# Patient Record
Sex: Male | Born: 1950
Health system: Southern US, Community
[De-identification: ages and names within clinical notes are randomized; demographics above are authoritative.]

## PROBLEM LIST (undated history)

## (undated) DIAGNOSIS — G250 Essential tremor: Secondary | ICD-10-CM

## (undated) DIAGNOSIS — E291 Testicular hypofunction: Principal | ICD-10-CM

## (undated) DIAGNOSIS — G2581 Restless legs syndrome: Secondary | ICD-10-CM

## (undated) DIAGNOSIS — E119 Type 2 diabetes mellitus without complications: Secondary | ICD-10-CM

## (undated) DIAGNOSIS — Z87442 Personal history of urinary calculi: Secondary | ICD-10-CM

## (undated) DIAGNOSIS — K760 Fatty (change of) liver, not elsewhere classified: Secondary | ICD-10-CM

## (undated) DIAGNOSIS — G589 Mononeuropathy, unspecified: Secondary | ICD-10-CM

## (undated) DIAGNOSIS — M5136 Other intervertebral disc degeneration, lumbar region: Secondary | ICD-10-CM

## (undated) DIAGNOSIS — J387 Other diseases of larynx: Secondary | ICD-10-CM

## (undated) DIAGNOSIS — N200 Calculus of kidney: Secondary | ICD-10-CM

## (undated) HISTORY — DX: Other diseases of larynx: J38.7

## (undated) HISTORY — DX: Testicular hypofunction: E29.1

## (undated) HISTORY — DX: Essential tremor: G25.0

## (undated) HISTORY — DX: Fatty (change of) liver, not elsewhere classified: K76.0

## (undated) HISTORY — PX: INGUINAL HERNIA REPAIR: SUR1180

## (undated) HISTORY — DX: Other intervertebral disc degeneration, lumbar region: M51.36

## (undated) HISTORY — DX: Personal history of urinary calculi: Z87.442

## (undated) HISTORY — DX: Restless legs syndrome: G25.81

---

## 2000-01-12 ENCOUNTER — Encounter (INDEPENDENT_AMBULATORY_CARE_PROVIDER_SITE_OTHER): Payer: Self-pay | Admitting: *Deleted

## 2000-01-12 ENCOUNTER — Ambulatory Visit (HOSPITAL_BASED_OUTPATIENT_CLINIC_OR_DEPARTMENT_OTHER): Admission: RE | Admit: 2000-01-12 | Discharge: 2000-01-12 | Payer: Self-pay | Admitting: *Deleted

## 2000-11-28 ENCOUNTER — Encounter: Admission: RE | Admit: 2000-11-28 | Discharge: 2000-11-28 | Payer: Self-pay | Admitting: Orthopedic Surgery

## 2000-11-28 ENCOUNTER — Encounter: Payer: Self-pay | Admitting: Orthopedic Surgery

## 2000-12-12 ENCOUNTER — Encounter: Admission: RE | Admit: 2000-12-12 | Discharge: 2000-12-12 | Payer: Self-pay | Admitting: Orthopedic Surgery

## 2000-12-12 ENCOUNTER — Encounter: Payer: Self-pay | Admitting: Orthopedic Surgery

## 2000-12-26 ENCOUNTER — Encounter: Payer: Self-pay | Admitting: Orthopedic Surgery

## 2000-12-26 ENCOUNTER — Encounter: Admission: RE | Admit: 2000-12-26 | Discharge: 2000-12-26 | Payer: Self-pay | Admitting: Orthopedic Surgery

## 2001-07-18 ENCOUNTER — Encounter: Admission: RE | Admit: 2001-07-18 | Discharge: 2001-10-16 | Payer: Self-pay | Admitting: Family Medicine

## 2002-04-11 HISTORY — PX: LUMBAR SPINE SURGERY: SHX701

## 2002-06-20 ENCOUNTER — Inpatient Hospital Stay (HOSPITAL_COMMUNITY): Admission: RE | Admit: 2002-06-20 | Discharge: 2002-06-21 | Payer: Self-pay | Admitting: Orthopaedic Surgery

## 2003-01-04 ENCOUNTER — Emergency Department (HOSPITAL_COMMUNITY): Admission: EM | Admit: 2003-01-04 | Discharge: 2003-01-04 | Payer: Self-pay | Admitting: Emergency Medicine

## 2003-02-10 HISTORY — PX: CYSTOSCOPY W/ URETEROSCOPY W/ LITHOTRIPSY: SUR380

## 2003-02-12 ENCOUNTER — Ambulatory Visit (HOSPITAL_BASED_OUTPATIENT_CLINIC_OR_DEPARTMENT_OTHER): Admission: RE | Admit: 2003-02-12 | Discharge: 2003-02-12 | Payer: Self-pay | Admitting: Urology

## 2003-02-18 ENCOUNTER — Encounter: Payer: Self-pay | Admitting: Emergency Medicine

## 2003-02-18 ENCOUNTER — Emergency Department (HOSPITAL_COMMUNITY): Admission: EM | Admit: 2003-02-18 | Discharge: 2003-02-18 | Payer: Self-pay | Admitting: Emergency Medicine

## 2005-07-12 HISTORY — PX: COLONOSCOPY: SHX174

## 2006-03-29 ENCOUNTER — Ambulatory Visit (HOSPITAL_COMMUNITY): Admission: RE | Admit: 2006-03-29 | Discharge: 2006-03-29 | Payer: Self-pay | Admitting: Specialist

## 2008-03-28 ENCOUNTER — Ambulatory Visit (HOSPITAL_COMMUNITY): Admission: RE | Admit: 2008-03-28 | Discharge: 2008-03-28 | Payer: Self-pay | Admitting: Urology

## 2008-03-28 ENCOUNTER — Emergency Department (HOSPITAL_COMMUNITY): Admission: EM | Admit: 2008-03-28 | Discharge: 2008-03-28 | Payer: Self-pay | Admitting: Emergency Medicine

## 2008-03-30 ENCOUNTER — Emergency Department (HOSPITAL_COMMUNITY): Admission: EM | Admit: 2008-03-30 | Discharge: 2008-03-30 | Payer: Self-pay | Admitting: Emergency Medicine

## 2009-07-12 HISTORY — PX: URETERAL STENT PLACEMENT: SHX822

## 2010-08-02 ENCOUNTER — Emergency Department (HOSPITAL_COMMUNITY)
Admission: EM | Admit: 2010-08-02 | Discharge: 2010-08-03 | Payer: Self-pay | Source: Home / Self Care | Admitting: Emergency Medicine

## 2010-08-04 LAB — URINALYSIS, ROUTINE W REFLEX MICROSCOPIC
Bilirubin Urine: NEGATIVE
Hgb urine dipstick: NEGATIVE
Ketones, ur: NEGATIVE mg/dL
Leukocytes, UA: NEGATIVE
Nitrite: POSITIVE — AB
Protein, ur: NEGATIVE mg/dL
Specific Gravity, Urine: 1.026 (ref 1.005–1.030)
Urine Glucose, Fasting: NEGATIVE mg/dL
Urobilinogen, UA: 1 mg/dL (ref 0.0–1.0)
pH: 5 (ref 5.0–8.0)

## 2010-08-04 LAB — URINE MICROSCOPIC-ADD ON

## 2010-08-05 LAB — URINE CULTURE
Colony Count: NO GROWTH
Culture  Setup Time: 201201231229
Culture: NO GROWTH

## 2010-08-06 ENCOUNTER — Ambulatory Visit: Admission: RE | Admit: 2010-08-06 | Payer: Self-pay | Source: Home / Self Care | Admitting: Urology

## 2010-11-27 NOTE — Op Note (Signed)
[facility]  Patient:    Micheal Guerra, Micheal Guerra                      MRN: [id number] Proc. Date: 01/12/00 Attending:  Maisie Fus B. Samuella Cota, M.D.                           Operative Report  PREOPERATIVE DIAGNOSIS:  Right inguinal hernia.  POSTOPERATIVE DIAGNOSIS:  Right inguinal hernia.  OPERATION:  Repair of right inguinal hernia with mesh.  SURGEON:  Maisie Fus B. Samuella Cota, M.D.  ANESTHESIA:  Local (0.25% Marcaine without epinephrine, 1% Xylocaine without epinephrine and sodium bicarbonate) with anesthesia monitoring - anesthesiologist and CRNA.  PROCEDURE:  Patient was taken to the operating room, placed on table in supine position.  The right lower quadrant of the abdomen was prepped and draped as sterile field.  The local anesthetic was used to block the ilioinguinal nerve and the area for the oblique incision.  An oblique incision was outlined with the skin marker.  The incision was made through skin and subcutaneous tissues with subcutaneous bleeders being cauterized with the Bovie.  The external oblique aponeurosis was identified and divided in line of its fibers to and through the external ring.  There was a nerve coming out of the external oblique aponeurosis medially and crossing the lower portion of the wound. This was protected.  The patient had a very large cord with hernia sac.  The cord was dissected free and a Penrose drain placed around it.  Dissection revealed a large lipoma of the cord coming off laterally.  Medially, the patient had a large indirect hernia sac, which extended down some 5-6 cm and was quite adherent to the cord structures.  Patient also had direct defect with a large protrusion of tissue coming through a large defect in the inguinal floor.  The hernia sac was opened and was noted to contain only omentum.  The omentum was reduced into the peritoneal cavity.  The excess hernia sac was removed and then the peritoneum was closed  with a running suture of 0 chromic catgut with the two ends tied to each other.  The lipoma of the cord was dissected back to its base at the internal ring where it was amputated and suture ligated at the base with 3-0 Vicryl sutures.  A 0 chromic was used to embrocate the inguinal floor, reducing the direct defect.  A piece of 3 inch x 6 inch mesh was then placed over the entire inguinal floor and extended around the cord structures superiorly and lateral to the internal ring.  The mesh was anchored inferiorly with a running suture of 2-0 Novofil with the first suture being placed in the pubic tubercle, the other sutures being placed in the shelving edge of Pouparts ligament.  The mesh was anchored superiorly and medially with interrupted sutures of 0 Novofil.  A slit was made into the mesh to accommodate the cord structures in the internal ring.  The mesh was overlapped superiorly and laterally, where the mesh was sutured to itself using 3-0 Vicryl.   Mesh seemed to be lying nicely in the inguinal floor.  Hemostasis was obtained.  The cord structures were still fairly thick, but there was no evidence of any further lipoma.  The cord structures were returned to their normal anatomical position and the external oblique aponeurosis was reapproximated with interrupted sutures of 3-0 Vicryl. The external ring was  slightly large, but because of the nerves in the area, it was elected not to try and make the external ring any smaller.  Wound was irrigated, Scarpas fascia was closed with 3-0 Vicryl and the skin was closed with a running subcuticular suture of 5-0 Vicryl.  Benzoin and half-inch Steri-Strips were used to reinforce the skin closure.  Dry sterile dressing was applied.  Patient seemed to tolerate procedure well and was taken to the PACU in satisfactory condition. DD:  01/12/00 TD:  01/12/00 Job: [id number]

## 2010-11-27 NOTE — Op Note (Signed)
NAME:  ROCKLAND, KOTARSKI NO.:  [id number]   MEDICAL RECORD NO.:  [id number]                   PATIENT TYPE:  INP   LOCATION:  2861                                 FACILITY:  [facility]   PHYSICIAN:  Mark C. Ophelia Charter, M.D.                 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   OPERATION PERFORMED:  L4-5 decompression, bilateral foraminotomies and  bilateral microdiskectomy.   SURGEON:  Mark C. Ophelia Charter, M.D.   ASSISTANT:  Dorann Lodge, P.A.-C.   ANESTHESIA:  General.  Marcaine skin local 0.5% 10 cc at skin closure.   ESTIMATED BLOOD LOSS:  100 cc.   DESCRIPTION OF PROCEDURE:  After induction of general anesthesia,  orotracheal intubation, preoperative Ancef prophylaxis, the patient was  placed in the kneeling position on the [place] frame with careful  positioning.  The back was prepped with DuraPrep in the usual area of square  towels, Betadine Vi-Drape and laminectomy sheets and drapes.  Cross table  lateral x-ray confirmed that the needle was just above the L4-5 space and a  second x-ray was taken with the needle positioned more distally.  After x-  ray a midline incision was made.  A self-retaining Weitlaner retractor was  placed.  Subperiosteal dissection on the lamina was performed.  The L4-5  space was identified by palpation and the L5-S1 space on x-ray was  correlated with the patient's findings and the posterior elements were a  solid mass of bone at L5-S1.  The L4-5 disk was actually several centimeters  inferior to the top of the iliac crest.  The L3-4 space was slightly above  it on a cross table lateral x-ray.  Due to the patient's abnormal anatomy, a  Kocher  clamp was placed directly over the expected L4-5 level.  Towel clips  were used to check for motion and the first level of motion was actually the  labeled L4-5 space which was the level of the pathology.  Cross table  lateral x-ray confirmed that the pedicle was directly over the L4-5 space  and inferior aspect of the spinous process was removed, the superior aspect  of the spinous process below was removed and laminotomy was performed  bilaterally with removal of thick hypertrophic ligaments.  There was  considerable more facet overgrowth than visualized on the preoperative MRI.  There was significant compression of the dura from right and left and after  removal of the ligament there was still significant facet overhang which was  causing compression.  Bone was removed out to the level of the pedicle and  foramina was enlarged bilaterally removing some large inferior hanging spurs  up  on the shoulder of the nerve root causing significant displacement which  also was not well visualized by the preop MRI scan.  Nerve root completely  decompressed and on each side the shoulder of the nerve root at the disk was  inspected.  There was a spur which appeared to be a large traction spur and  the disk was calcified in the midline but was ballottable.  This may have  been calcification of the posterior longitudinal ligament or possibly an old  portion of disk that had herniated and had been calcified.  In any event a  small puncture site was made with a #4.  A 2 mm Kerrison was entered and  bone was removed until a large enough hole was made that progressively  larger Kerrison's could be inserted to remove enough bone to fit the  micropituitary and then the large pituitary.  In the midline continued  dissection underneath the some remaining portion of the ligament was  performed and the thin portion of bone was broken off using Kerrison  rongeurs and meticulously microdissected off the anterior  aspect of the dura  and removed.  Straight pituitaries were used to remove spurring until it was  flat with the base of the canal.  Palpation with hockey stick was performed  anterior to the dura with 180 degree sweep.  Microscope which was being used  for microdissection was switched back and forth from right to lateral gutter  and gentle retraction of the dura was performed to make sure that there was  complete decompression.  Passes with the straight pituitary revealed a large  amount of degenerative disk material which was immediately underneath the  old calcified bone which probably represented an old disk herniation with  calcification.  Once this was decompressed, there was no anterior  compression, both foramina were enlarged, carefully inspected.  Axilla of  the nerve root was well visualized.  The shoulder of the nerve root was  free.  Hockey stick could be placed out the dura anterolateral and inferior  to the nerve root with no areas of compression.  Disk was irrigated and  suctioned dry.  Operating microscope was removed and the fascia was closed  with 0 Vicryl and 2-0 Vicryl in the subcutaneous tissue.  Skin staple  closure, Marcaine infiltration in the skin and a postoperative dressing.  Instrument and needle count was correct.  The patient tolerated the  procedure well, was transferred to the recovery room in stable condition.                                               Mark C. Ophelia Charter, M.D.    MCY/MEDQ  D:  06/20/2002  T:  06/20/2002  Job:  [id number]

## 2010-11-27 NOTE — Op Note (Signed)
NAME:  AXCEL, HORSCH                       ACCOUNT NO.:  [id number]   MEDICAL RECORD NO.:  [id number]                   PATIENT TYPE:  AMB   LOCATION:  [facility]                                 FACILITY:  [facility]   PHYSICIAN:  Excell Seltzer. Annabell Howells, M.D.                 DATE OF BIRTH:  1951/06/22   DATE OF PROCEDURE:  02/12/2003  DATE OF DISCHARGE:                                 OPERATIVE REPORT   PROCEDURE:  Cystoscopy, left retrograde pyelogram with interpretation, left  ureteroscopy with holmium lasertripsy, insertion of left double J stent.   PREOPERATIVE DIAGNOSIS:  Left ureteral stone.   POSTOPERATIVE DIAGNOSIS:  Left renal stone.   SURGEON:  Excell Seltzer. Annabell Howells, M.D.   ANESTHESIA:  General.   DRAINS:  6 French x 26 cm double J stent.   SPECIMENS:  Stone fragments.   COMPLICATIONS:  None.   INDICATIONS FOR PROCEDURE:  Mr. Corpus is a 60 year old white male with a  large asymptomatic left ureteral stone that has not progressed. He is to  undergo a ureteroscopic stone extraction.   DESCRIPTION OF PROCEDURE:  The patient was taken to the operating room and  he was given p.o. Cipro preoperatively. He was given a general anesthetic,  placed in lithotomy position, his perineum and genitalia were prepped with  Betadine solution, he was draped in the usual sterile fashion. Cystoscopy  was performed using a 22 [place] scope and the 12 and 70 degree lenses.  Examination revealed a normal urethra. The external sphincter was intact,  the prostatic urethra short without obstruction. Examination of the bladder  revealed mild trabeculation without tumor, stones or inflammation. The  ureteral orifices were in their normal anatomic position effluxing clear  urine. The left ureteral orifice was cannulated with a 5 [place] open end  catheter, contrast was instilled in a retrograde fashion. Surprisingly there  was no evidence of filling defect in the ureter. However, in the renal  pelvis there was a  triangular shaped filling defect consistent with a stone  which had previously been noted in the ureter. I re-reviewed his CT scan and  there were no large stones in the kidney on the CT scan just a large  ureteral stone. At this point, it was felt that flexible ureteroscopy was  going to be required. A guidewire was placed in the kidney, a 12/14 access  sheath 35 cm in length was passed over the wire just below the kidney. The  central coil and wire were removed and a 6 1/2 [place] flexible ureteroscope  were passed. The stone was identified and engaged with the homium laser and  fragmented into 2-3 mm fragments. At this point, a basket was used to  extract as many of the fragments as possible. A few small fragments were  left in the kidney but were felt to be small enough to pass. At this point,  the ureteroscope was removed, the  guidewire was placed back up the access  sheath, the access sheath was removed. The cystoscope was replaced over the  wire and a 6 French 26 cm double J stent was inserted without difficulty  over the wire under fluoroscopic guidance. The wire was removed leaving a  good coil in the bladder, good  coil in the kidney. The bladder was drained. The cystoscope was removed  leaving the stent string exiting the urethra. The patient was taken down  from lithotomy position, his anesthetic was reversed, he was moved to the  recovery room in stable condition and there were no complications.                                               Excell Seltzer. Annabell Howells, M.D.    JJW/MEDQ  D:  02/12/2003  T:  02/12/2003  Job:  [id number]

## 2011-03-29 ENCOUNTER — Encounter: Payer: Self-pay | Admitting: Gastroenterology

## 2011-04-05 ENCOUNTER — Telehealth: Payer: Self-pay | Admitting: *Deleted

## 2011-04-05 ENCOUNTER — Ambulatory Visit (AMBULATORY_SURGERY_CENTER): Payer: BC Managed Care – PPO | Admitting: *Deleted

## 2011-04-05 VITALS — Ht 72.0 in | Wt 250.0 lb

## 2011-04-05 DIAGNOSIS — Z1211 Encounter for screening for malignant neoplasm of colon: Secondary | ICD-10-CM

## 2011-04-05 MED ORDER — PEG-KCL-NACL-NASULF-NA ASC-C 100 G PO SOLR: ORAL | Status: DC

## 2011-04-05 NOTE — Telephone Encounter (Signed)
Pt. Received a letter from [facility] that it was time for his colon recall done 5 years ago.  Pt. Says as far as he knows he did not have any polyps and does not have family history of colon cancer.  Release of medical information  Obtained and given to Robin Stallings.  . JoAnn Janifer Gieselman   

## 2011-04-05 NOTE — Progress Notes (Signed)
Pt. Received a letter from [facility] that it was time for his colon recall done 5 years ago.  Pt. Says as far as he knows he did not have any polyps and does not have family history of colon cancer.  Release of medical information  Obtained and given to Merri Ray.  Wyona Almas

## 2011-04-06 ENCOUNTER — Encounter: Payer: Self-pay | Admitting: Gastroenterology

## 2011-04-12 LAB — URINALYSIS, ROUTINE W REFLEX MICROSCOPIC
Glucose, UA: NEGATIVE
Hgb urine dipstick: NEGATIVE
Ketones, ur: NEGATIVE
Leukocytes, UA: NEGATIVE
Nitrite: NEGATIVE
Protein, ur: NEGATIVE
Urobilinogen, UA: 0.2

## 2011-04-12 LAB — BASIC METABOLIC PANEL
BUN: 19
Chloride: 109
Potassium: 4.4

## 2011-04-12 LAB — CBC
HCT: 43.2
Hemoglobin: 14.9
MCV: 89.3
Platelets: 246
RBC: 4.84
WBC: 10.4

## 2011-04-12 LAB — POCT I-STAT, CHEM 8
Calcium, Ion: 1.16
Creatinine, Ser: 1.3
Glucose, Bld: 107 — ABNORMAL HIGH
Glucose, Bld: 134 — ABNORMAL HIGH
HCT: 42
Hemoglobin: 14.3
Hemoglobin: 15.3
Potassium: 4.7
Sodium: 134 — ABNORMAL LOW
TCO2: 26

## 2011-04-12 LAB — DIFFERENTIAL
Eosinophils Absolute: 0.5
Eosinophils Relative: 5
Lymphs Abs: 2.4
Monocytes Absolute: 0.8
Monocytes Relative: 8

## 2011-04-12 LAB — URINE MICROSCOPIC-ADD ON

## 2011-04-13 ENCOUNTER — Telehealth: Payer: Self-pay | Admitting: Gastroenterology

## 2011-04-14 NOTE — Telephone Encounter (Signed)
Pts family called back and cancelled the appt for the colon. Family states that they called [facility] and that the pt had no polyps on the previous colon.

## 2011-04-15 NOTE — Telephone Encounter (Signed)
Pts colonoscopy report was received on 04/05/2011 the day the release was signed I Gave the report back to previsit to put on the pts chart for Dr Arlyce Dice to review. Instead of giving it directly to Dr Arlyce Dice  Pt has cancelled colonoscopy due to pt having a normal colonoscopy in 2007.

## 2011-04-15 NOTE — Telephone Encounter (Signed)
This report was received on 04/05/2011 I faxed release and received report back instantaneously I gave the report back to Previsit to put on the front of pts chart.  Never documented that the report was actually received.  I currently have the report and will have scan in for the pts records. Pt has cancelled colonoscopy due to pt having a normal colonoscopy in 2007

## 2011-04-19 ENCOUNTER — Other Ambulatory Visit: Payer: Self-pay | Admitting: Gastroenterology

## 2011-07-13 DIAGNOSIS — J387 Other diseases of larynx: Secondary | ICD-10-CM

## 2011-07-13 HISTORY — DX: Other diseases of larynx: J38.7

## 2011-07-27 ENCOUNTER — Other Ambulatory Visit: Payer: Self-pay | Admitting: Otolaryngology

## 2011-07-27 ENCOUNTER — Ambulatory Visit
Admission: RE | Admit: 2011-07-27 | Discharge: 2011-07-27 | Disposition: A | Discharge status: Home / Self Care | Payer: BC Managed Care – PPO | Source: Ambulatory Visit | Attending: Otolaryngology | Admitting: Otolaryngology

## 2011-07-27 DIAGNOSIS — R498 Other voice and resonance disorders: Secondary | ICD-10-CM

## 2011-07-27 DIAGNOSIS — E079 Disorder of thyroid, unspecified: Secondary | ICD-10-CM

## 2011-07-27 DIAGNOSIS — R490 Dysphonia: Secondary | ICD-10-CM

## 2011-07-27 MED ORDER — IOHEXOL 300 MG/ML  SOLN
75.0000 mL | Freq: Once | INTRAMUSCULAR | Status: AC | PRN
  Administered 2011-07-27: 75 mL via INTRAVENOUS

## 2011-08-09 ENCOUNTER — Other Ambulatory Visit: Payer: Self-pay | Admitting: Family Medicine

## 2011-08-09 ENCOUNTER — Telehealth: Payer: Self-pay | Admitting: Family Medicine

## 2011-08-09 ENCOUNTER — Ambulatory Visit (HOSPITAL_BASED_OUTPATIENT_CLINIC_OR_DEPARTMENT_OTHER): Payer: BC Managed Care – PPO

## 2011-08-09 ENCOUNTER — Encounter: Payer: Self-pay | Admitting: Family Medicine

## 2011-08-09 ENCOUNTER — Ambulatory Visit (INDEPENDENT_AMBULATORY_CARE_PROVIDER_SITE_OTHER): Payer: BC Managed Care – PPO | Admitting: Family Medicine

## 2011-08-09 DIAGNOSIS — M545 Low back pain, unspecified: Secondary | ICD-10-CM

## 2011-08-09 DIAGNOSIS — M79609 Pain in unspecified limb: Secondary | ICD-10-CM

## 2011-08-09 DIAGNOSIS — G2581 Restless legs syndrome: Secondary | ICD-10-CM

## 2011-08-09 DIAGNOSIS — R9431 Abnormal electrocardiogram [ECG] [EKG]: Secondary | ICD-10-CM

## 2011-08-09 DIAGNOSIS — R635 Abnormal weight gain: Secondary | ICD-10-CM

## 2011-08-09 DIAGNOSIS — G4710 Hypersomnia, unspecified: Secondary | ICD-10-CM | POA: Insufficient documentation

## 2011-08-09 DIAGNOSIS — R160 Hepatomegaly, not elsewhere classified: Secondary | ICD-10-CM

## 2011-08-09 DIAGNOSIS — M79602 Pain in left arm: Secondary | ICD-10-CM

## 2011-08-09 DIAGNOSIS — R209 Unspecified disturbances of skin sensation: Secondary | ICD-10-CM

## 2011-08-09 DIAGNOSIS — M436 Torticollis: Secondary | ICD-10-CM

## 2011-08-09 DIAGNOSIS — R5381 Other malaise: Secondary | ICD-10-CM

## 2011-08-09 DIAGNOSIS — R202 Paresthesia of skin: Secondary | ICD-10-CM

## 2011-08-09 DIAGNOSIS — R5383 Other fatigue: Secondary | ICD-10-CM

## 2011-08-09 HISTORY — DX: Restless legs syndrome: G25.81

## 2011-08-09 LAB — LIPID PANEL
Total CHOL/HDL Ratio: 3.6 Ratio
VLDL: 24 mg/dL (ref 0–40)

## 2011-08-09 LAB — COMPREHENSIVE METABOLIC PANEL
AST: 47 U/L — ABNORMAL HIGH (ref 0–37)
Albumin: 4.6 g/dL (ref 3.5–5.2)
Alkaline Phosphatase: 97 U/L (ref 39–117)
Potassium: 4.8 mEq/L (ref 3.5–5.3)
Sodium: 141 mEq/L (ref 135–145)
Total Protein: 7.2 g/dL (ref 6.0–8.3)

## 2011-08-09 MED ORDER — CLONAZEPAM 1 MG PO TABS: ORAL_TABLET | ORAL | Status: DC

## 2011-08-09 NOTE — Assessment & Plan Note (Addendum)
Likely related to deconditioning and obesity. However, with his excessive somnolence and subtle pulmonary-type EKG abnormalities I want to check a home sleep study to r/o OSA. Will also check routine labs today (fasting): CBC, CMET, testosterone, vit B12 level.  Pt says a thyroid hormone check by his ENT was recently normal.

## 2011-08-09 NOTE — Telephone Encounter (Signed)
Pls request records from Dr. Haroldine Laws, ENT on [facility] in [place].  thx.

## 2011-08-09 NOTE — Assessment & Plan Note (Signed)
RAE+ poor R wave progression.  Some changes suggestive of pulm dz, but he has no hx of smoking. Will pursue sleep study to r/o OSA.

## 2011-08-09 NOTE — Assessment & Plan Note (Signed)
No sign of fluid overload on exam EXCEPT for possibly in his abdomen. With question of hepatomegaly on exam I'll check complete abdominal ultrasound. He'll definitely need to start low cal/low fat diet soon.

## 2011-08-09 NOTE — Patient Instructions (Signed)
Please give patient directions/map to [facility].  

## 2011-08-09 NOTE — Assessment & Plan Note (Signed)
Suspect cervical spine DDD with radiculopathy. I have low suspicion that this is cardiac related. Will check C-spine plain film, may make PT referral at next f/u in 10d. If c-spine highly unremarkable for arthritic changes and sleep study is negative for OSA, then would refer to cardiology for risk stratification/anginal equivalent.

## 2011-08-09 NOTE — Progress Notes (Signed)
Office Note 08/09/2011  CC:  Chief Complaint  Patient presents with  . Establish Care    throat infection, bulge in stomach    HPI:  Micheal Guerra is a 61 y.o. White male who is here to establish care and discuss throat infection and other issues. Patient's most recent primary MD: none Old records were not reviewed prior to or during today's visit.  Pt here with his wife.  They outline numerous concerns lately:  1) Recent loss of voice, question of anterior neck mass/swelling, they sought care at ENT (Dr. Haroldine Laws) and w/u revealed small laryngeal polyp and "throat infection" per pt report.  He was put on cefuroxime 250 mg po bid and has been on this x 2d.  He was also given mucinex ER. 2) midline abdominal protrusion present for years: ? Getting more prominent last 1-2 mo.  No pain.  The area "pooches" out and then goes back in.   3) Wt gain 10-15 lbs in last month or so.  Says he's had no change in eating habits or activity during this time.  Also notes increased tendency to be breathless with normal activity but seems to have good days and bad days with regard to this.  +Fatigue.  No fevers.   4) Reports tingling, numbness, irresistible urge to move legs while trying to go to sleep. Has been going on for months. 5) Left arm with intermittent feeling of achiness, tingling, "asleep" feeling--from the shoulder all the way down to fingers.  Not related to exercise/exertion, but certain arm movements do sometimes make it occur or relieve it some.  Denies neck pain but admits his neck is fairly stiff, has limited ROM.  Has no hx of neck problems or neck surgery.   Denies any CP, nausea, diaphoresis, SOB, or palpitations associated with these left arm sensations. 6) Low back pain lately, with some intermittent radiation down R leg> L leg.  Describes tingling/numbness at times with the radicular pain.    Past Medical History  Diagnosis Date  . Hereditary essential tremor   . History of  nephrolithiasis   . DDD (degenerative disc disease), lumbar     Past Surgical History  Procedure Date  . Lumbar spine surgery 2002    ruptured disk  . Inguinal hernia repair 2004  and  2005    bilateral  . Colonoscopy 2007    normal per pt report    Family History  Problem Relation Age of Onset  . Cancer Mother     breast  . Heart disease Father     CAD/MI in his 45s  . Hypertension Father   . Diabetes Sister     History   Social History  . Marital Status: Married    Spouse Name: N/A    Number of Children: N/A  . Years of Education: N/A   Occupational History  . Not on file.   Social History Main Topics  . Smoking status: Never Smoker   . Smokeless tobacco: Former Neurosurgeon    Types: Chew    Quit date: 06/12/2011  . Alcohol Use: No  . Drug Use: No  . Sexually Active: Not on file   Other Topics Concern  . 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   MEDS: ranitidine, ibuprofen, cefuroxime, mucinex ER  No Known Allergies  ROS Review of Systems  Constitutional: Positive for fatigue and unexpected weight change. Negative for fever, chills and appetite change.  HENT: Positive for hearing loss (?worse the last month: + noise exposure chronically at work). Negative for ear pain, congestion, sore throat, neck stiffness and dental problem.   Eyes: Negative for discharge, redness and visual disturbance.  Respiratory: Positive for apnea (wife witnesses frequent sleep apeneic events, +snoring). Negative for cough, chest tightness, shortness of breath and wheezing.   Cardiovascular: Negative for chest pain, palpitations and leg swelling.  Gastrointestinal: Negative for nausea, vomiting,  abdominal pain, diarrhea and blood in stool.       +heartburn  Genitourinary: Positive for flank pain (intermittent lately, mainly in mornings, worse with getting up and moving around). Negative for dysuria, urgency, frequency, hematuria and difficulty urinating.  Musculoskeletal: Positive for back pain. Negative for myalgias, joint swelling and arthralgias.  Skin: Negative for pallor and rash.  Neurological: Negative for dizziness, speech difficulty, weakness and headaches.  Hematological: Negative for adenopathy. Does not bruise/bleed easily.  Psychiatric/Behavioral: Positive for sleep disturbance. Negative for confusion and dysphoric mood. The patient is not nervous/anxious.     PE; Blood pressure 151/89, pulse 72, temperature 98.6 F (37 C), temperature source Temporal, height 6' (1.829 m), weight 260 lb (117.935 kg), SpO2 94.00%. Gen: Alert, well appearing, obese white male.  Patient is oriented to person, place, time, and situation.  Affect is pleasant, thought and speech are lucid. ENT: Ears: EACs clear, normal epithelium.  TMs with good light reflex and landmarks bilaterally.  Eyes: no injection, icteris, swelling, or exudate.  EOMI, PERRLA. Nose: no drainage or turbinate edema/swelling.  No injection or focal lesion.  Mouth: lips without lesion/swelling.  Oral mucosa pink and moist.  Dentition intact and without obvious caries or gingival swelling.  Oropharynx without erythema, exudate, or swelling.  Neck - No masses or thyromegaly.  Neck ROM limited/stiff, but no pain.  Carotids 1+ bilat, without bruit. Neck nontender to palpation.   Spurling's negative. RRR, without murmur, rub, or gallop. Carotid pulses easily palpable, symmetric pulsations, no bruit or palpable dilatation. Radial, femoral, and ankle pulses easily palpable and symmetric. No abdominal bruit. No varicosities.  Cap refill in extremities is brisk. Chest with symmetric expansion, good aeration, nonlabored respirations.   Clear and equal breath sounds in all lung fields.  No clubbing or cyanosis. ABD: soft, question of mild distention vs rotund.  Mild TTP in RUQ when pt takes deep breaths.  Difficult to discern hepatomegaly due to body habitus, but I question firm, tender lower border of liver about 4 finger breadths below right costal margin.  BS normal.   No bruit or mass.  His abd is generally pretty dull to percussion all over.  With contraction of abd wall muscles he has a large midline soft tissue protrusion that is soft and extends from epigastric level down to below umbillicus, consistent with diastasis recti.  No tenderness in this region.  Low back: nontender.  +pain with flexion of L-spine but all other motions without pain.   Trace Patellar and achilles DTRs bilat.  No LE weakness.   SKIN: no jaundice, rash, or pallor.  Pertinent labs:  12 lead EKG: NSR, RAE, poor R wave progression.  No changes suggestive of acute ischemia.    ASSESSMENT AND PLAN:   Fatigue Likely related to deconditioning and obesity. However, with his excessive somnolence  and subtle pulmonary-type EKG abnormalities I want to check a home sleep study to r/o OSA. Will also check routine labs today (fasting): CBC, CMET, testosterone, vit B12 level.  Pt says a thyroid hormone check by his ENT was recently normal.    Weight gain, abnormal No sign of fluid overload on exam EXCEPT for possibly in his abdomen. With question of hepatomegaly on exam I'll check complete abdominal ultrasound. He'll definitely need to start low cal/low fat diet soon.  Restless legs syndrome Check ferritin. Start clonazepam 1mg , 1/2-1 tab po qhs.  Therapeutic expectations and side effect profile of medication discussed today.  Patient's questions answered.   Left arm pain Suspect cervical spine DDD with radiculopathy. I have low suspicion that this is cardiac related. Will check C-spine plain film, may make PT referral at next f/u in 10d. If c-spine  highly unremarkable for arthritic changes and sleep study is negative for OSA, then would refer to cardiology for risk stratification/anginal equivalent.    Abnormal EKG RAE+ poor R wave progression.  Some changes suggestive of pulm dz, but he has no hx of smoking. Will pursue sleep study to r/o OSA.   Laryngeal pathology: will obtain recent ENT eval records.  He'll take his abx and mucinex as rx'd by Dr. Haroldine Laws.  Low back pain: DDD, possble spinal stenosis.  Check L-spine plain films to start.  Return in about 10 days (around 08/19/2011) for f/u RLS, wt gain, neck and back pain.

## 2011-08-09 NOTE — Assessment & Plan Note (Signed)
Check ferritin. Start clonazepam 1mg , 1/2-1 tab po qhs.  Therapeutic expectations and side effect profile of medication discussed today.  Patient's questions answered.

## 2011-08-10 ENCOUNTER — Encounter: Payer: Self-pay | Admitting: Family Medicine

## 2011-08-10 DIAGNOSIS — E291 Testicular hypofunction: Secondary | ICD-10-CM | POA: Insufficient documentation

## 2011-08-10 HISTORY — DX: Testicular hypofunction: E29.1

## 2011-08-10 LAB — CBC WITH DIFFERENTIAL/PLATELET
Basophils Absolute: 0.1 10*3/uL (ref 0.0–0.1)
Basophils Relative: 1 % (ref 0–1)
Hemoglobin: 16.1 g/dL (ref 13.0–17.0)
MCHC: 34.4 g/dL (ref 30.0–36.0)
Monocytes Relative: 9 % (ref 3–12)
Neutro Abs: 6.2 10*3/uL (ref 1.7–7.7)
Neutrophils Relative %: 58 % (ref 43–77)
RDW: 12.8 % (ref 11.5–15.5)
WBC: 10.7 10*3/uL — ABNORMAL HIGH (ref 4.0–10.5)

## 2011-08-10 LAB — TESTOSTERONE: Testosterone: 134.22 ng/dL — ABNORMAL LOW (ref 250–890)

## 2011-08-10 NOTE — Telephone Encounter (Signed)
Faxed request

## 2011-08-11 ENCOUNTER — Ambulatory Visit (INDEPENDENT_AMBULATORY_CARE_PROVIDER_SITE_OTHER)
Admission: RE | Admit: 2011-08-11 | Discharge: 2011-08-11 | Disposition: A | Discharge status: Home / Self Care | Payer: BC Managed Care – PPO | Source: Ambulatory Visit | Attending: Family Medicine | Admitting: Family Medicine

## 2011-08-11 ENCOUNTER — Ambulatory Visit (HOSPITAL_BASED_OUTPATIENT_CLINIC_OR_DEPARTMENT_OTHER)
Admission: RE | Admit: 2011-08-11 | Discharge: 2011-08-11 | Disposition: A | Discharge status: Home / Self Care | Payer: BC Managed Care – PPO | Source: Ambulatory Visit | Attending: Family Medicine | Admitting: Family Medicine

## 2011-08-11 DIAGNOSIS — N1330 Unspecified hydronephrosis: Secondary | ICD-10-CM | POA: Insufficient documentation

## 2011-08-11 DIAGNOSIS — R9431 Abnormal electrocardiogram [ECG] [EKG]: Secondary | ICD-10-CM

## 2011-08-11 DIAGNOSIS — R5381 Other malaise: Secondary | ICD-10-CM

## 2011-08-11 DIAGNOSIS — M545 Low back pain, unspecified: Secondary | ICD-10-CM

## 2011-08-11 DIAGNOSIS — R202 Paresthesia of skin: Secondary | ICD-10-CM

## 2011-08-11 DIAGNOSIS — M899 Disorder of bone, unspecified: Secondary | ICD-10-CM

## 2011-08-11 DIAGNOSIS — R0989 Other specified symptoms and signs involving the circulatory and respiratory systems: Secondary | ICD-10-CM

## 2011-08-11 DIAGNOSIS — M949 Disorder of cartilage, unspecified: Secondary | ICD-10-CM

## 2011-08-11 DIAGNOSIS — M5380 Other specified dorsopathies, site unspecified: Secondary | ICD-10-CM

## 2011-08-11 DIAGNOSIS — R82998 Other abnormal findings in urine: Secondary | ICD-10-CM | POA: Insufficient documentation

## 2011-08-11 DIAGNOSIS — R635 Abnormal weight gain: Secondary | ICD-10-CM | POA: Insufficient documentation

## 2011-08-11 DIAGNOSIS — M436 Torticollis: Secondary | ICD-10-CM

## 2011-08-11 DIAGNOSIS — R5383 Other fatigue: Secondary | ICD-10-CM

## 2011-08-11 DIAGNOSIS — M79602 Pain in left arm: Secondary | ICD-10-CM

## 2011-08-11 DIAGNOSIS — IMO0002 Reserved for concepts with insufficient information to code with codable children: Secondary | ICD-10-CM

## 2011-08-11 DIAGNOSIS — R160 Hepatomegaly, not elsewhere classified: Secondary | ICD-10-CM | POA: Insufficient documentation

## 2011-08-11 DIAGNOSIS — M4802 Spinal stenosis, cervical region: Secondary | ICD-10-CM

## 2011-08-11 LAB — PROLACTIN: Prolactin: 3.4 ng/mL (ref 2.1–17.1)

## 2011-08-11 LAB — LUTEINIZING HORMONE

## 2011-08-11 NOTE — Progress Notes (Signed)
Quick Note:  Pls notify pt that his x-rays confirmed significant arthritis in neck and low back and this can explain his low back pains and left arm sx's. I recommend physical therapy as the next step. If he agrees now then I'll order this. If they prefer to wait and discuss at f/u visit that is fine, too. Also, his chest x-ray was unremarkable. His abdominal ultrasound showed some fatty infiltration in the liver but otherwise was normal. His cholesterol testing was normal but sometimes levels are normal in blood but there is excessive fatty collection in the liver still. I'd like to do some additional blood work next visit : advanced lipid testing. I also want to repeat his testosterone level b/c it was quite low and if retesting confirms that this is truly very low then will need to start testosterone replacement therapy. We'll go over things in detail at his f/u visit. He does NOT need to come to next appt fasting. Thx ______

## 2011-08-12 ENCOUNTER — Telehealth: Payer: Self-pay | Admitting: Family Medicine

## 2011-08-12 NOTE — Telephone Encounter (Signed)
Do we have the results from the [place] yet? Patient's wife is on DPR, okay to release info. She will be home after 3PM.

## 2011-08-12 NOTE — Telephone Encounter (Signed)
Answered in result note 

## 2011-08-13 ENCOUNTER — Other Ambulatory Visit: Payer: Self-pay | Admitting: Family Medicine

## 2011-08-13 MED ORDER — TRAMADOL HCL 50 MG PO TABS: ORAL_TABLET | ORAL | Status: DC

## 2011-08-13 NOTE — Progress Notes (Signed)
We can try some tramadol to use only sparingly.  I'll do eRX.  Please notify him that this med may cause slight sedation and it can be habit-forming, so I encourage him to use it no more than 1-2 times per day, and preferably not every day.  -thx

## 2011-08-19 ENCOUNTER — Ambulatory Visit (INDEPENDENT_AMBULATORY_CARE_PROVIDER_SITE_OTHER): Payer: BC Managed Care – PPO | Admitting: Family Medicine

## 2011-08-19 ENCOUNTER — Encounter: Payer: Self-pay | Admitting: Family Medicine

## 2011-08-19 VITALS — BP 151/93 | HR 65 | Ht 72.0 in | Wt 257.0 lb

## 2011-08-19 DIAGNOSIS — R5383 Other fatigue: Secondary | ICD-10-CM

## 2011-08-19 DIAGNOSIS — K760 Fatty (change of) liver, not elsewhere classified: Secondary | ICD-10-CM

## 2011-08-19 DIAGNOSIS — J387 Other diseases of larynx: Secondary | ICD-10-CM

## 2011-08-19 DIAGNOSIS — R5381 Other malaise: Secondary | ICD-10-CM

## 2011-08-19 DIAGNOSIS — K7689 Other specified diseases of liver: Secondary | ICD-10-CM

## 2011-08-19 DIAGNOSIS — M479 Spondylosis, unspecified: Secondary | ICD-10-CM

## 2011-08-19 DIAGNOSIS — E291 Testicular hypofunction: Secondary | ICD-10-CM

## 2011-08-19 DIAGNOSIS — G2581 Restless legs syndrome: Secondary | ICD-10-CM

## 2011-08-19 NOTE — Progress Notes (Signed)
Home sleep study cancelled per MD request.

## 2011-08-19 NOTE — Progress Notes (Signed)
Addended by: Luisa Dago on: 08/19/2011 05:14 PM   Modules accepted: Orders

## 2011-08-20 ENCOUNTER — Ambulatory Visit (INDEPENDENT_AMBULATORY_CARE_PROVIDER_SITE_OTHER): Payer: BC Managed Care – PPO | Admitting: Internal Medicine

## 2011-08-20 ENCOUNTER — Encounter: Payer: Self-pay | Admitting: Internal Medicine

## 2011-08-20 VITALS — BP 118/62 | HR 79 | Temp 97.7°F | Ht 72.0 in | Wt 264.0 lb

## 2011-08-20 DIAGNOSIS — R0600 Dyspnea, unspecified: Secondary | ICD-10-CM

## 2011-08-20 DIAGNOSIS — R05 Cough: Secondary | ICD-10-CM

## 2011-08-20 DIAGNOSIS — R059 Cough, unspecified: Secondary | ICD-10-CM

## 2011-08-20 DIAGNOSIS — R0989 Other specified symptoms and signs involving the circulatory and respiratory systems: Secondary | ICD-10-CM

## 2011-08-20 NOTE — Patient Instructions (Signed)
Take delsym two tsp every 12 hours and supplement if needed with  tramadol 50 mg 1- 2 every 4 hours to suppress the urge to cough and clear your throat. Swallowing water or using ice chips/non mint and menthol containing candies (such as lifesavers or sugarless jolly ranchers) are also effective.  You should rest your voice and avoid activities that you know make you cough.  Once you have eliminated the cough for 3 straight days try reducing the tramadol first,  then the delsym as tolerated.    Avoid further exposure to heavy fumes and smoke at work  Try prilosec 20mg   Take 30-60 min before first meal of the day and Pepcid 20 mg one bedtime until you see Dr Haroldine Laws.  GERD (REFLUX)  is an extremely common cause of respiratory symptoms, many times with no significant heartburn at all.    It can be treated with medication, but also with lifestyle changes including avoidance of late meals, excessive alcohol, smoking cessation, and avoid fatty foods, chocolate, peppermint, colas, red wine, and acidic juices such as orange juice.  NO MINT OR MENTHOL PRODUCTS SO NO COUGH DROPS  USE SUGARLESS CANDY INSTEAD (jolley ranchers or Stover's)  NO OIL BASED VITAMINS - use powdered substitutes.

## 2011-08-20 NOTE — Progress Notes (Signed)
  Subjective:    Patient ID: Micheal Guerra, male    DOB: 1950/08/10    MRN: [id number]  HPI   65 yowm never smoker never previous respiratory problems works at [employer] since the 1980s with new onset breathing problems early Dec 2012 with w/u by Dr Haroldine Laws in 07/2011 with minimal VC edema and discolored trachea suggestive of chemical injury so referred 08/20/11 to pulmonary clinic.  08/20/2011 Silas Muff/ ov cc doe x early Dec 2012 x one flight of steps that before Thanksgiving could still do s sob assoc with wt gain x 20 lbs an marked hoarseness with  dry cough > eval 1/15 by Dr Haroldine Laws with ddx ? Sinus dz/ ? Reflux ? Chemical injury to trachea.  CT neck and cxr wnl 07/27/11.  Did have one exposure at work to burned chemicals but not directly connected to symptoms and does wear respirator at work. No change work vs home in terms of symptoms, no co-workers with similar problems.  Sleeping ok without nocturnal  or early am exacerbation  of respiratory  c/o's or need for noct saba. Also denies any obvious fluctuation of symptoms with weather or environmental changes or other aggravating or alleviating factors except as outlined above    Review of Systems  Constitutional: Negative for fever, chills, activity change, appetite change and unexpected weight change.  HENT: Negative for congestion, sore throat, rhinorrhea, sneezing, trouble swallowing, dental problem, voice change and postnasal drip.   Eyes: Negative for visual disturbance.  Respiratory: Positive for cough and shortness of breath. Negative for choking.   Cardiovascular: Negative for chest pain and leg swelling.  Gastrointestinal: Negative for nausea, vomiting and abdominal pain.  Genitourinary: Negative for difficulty urinating.  Musculoskeletal: Positive for arthralgias.  Skin: Negative for rash.  Psychiatric/Behavioral: Negative for behavioral problems and confusion.       Objective:   Physical Exam  Very hoarse amb wm nad at  rest with prominent pseudowheeze  Wt 264 08/20/11  HEENT: nl dentition, turbinates, and orophanx. Nl external ear canals without cough reflex   NECK :  without JVD/Nodes/TM/ nl carotid upstrokes bilaterally   LUNGS: no acc muscle use, clear to A and P bilaterally without cough on insp or exp maneuvers   CV:  RRR  no s3 or murmur or increase in P2, no edema   ABD:  soft and nontender with nl excursion in the supine position. No bruits or organomegaly, bowel sounds nl  MS:  warm without deformities, calf tenderness, cyanosis or clubbing  SKIN: warm and dry without lesions    NEURO:  alert, approp, no deficits   cxr 08/09/11 No acute cardiopulmonary disease. Slight stable prominence of the  interstitial lung markings.      Assessment & Plan:

## 2011-08-21 DIAGNOSIS — R05 Cough: Secondary | ICD-10-CM | POA: Insufficient documentation

## 2011-08-21 DIAGNOSIS — R059 Cough, unspecified: Secondary | ICD-10-CM | POA: Insufficient documentation

## 2011-08-21 NOTE — Assessment & Plan Note (Signed)
The most common causes of chronic cough in immunocompetent adults include the following: upper airway cough syndrome (UACS), previously referred to as postnasal drip syndrome (PNDS), which is caused by variety of rhinosinus conditions; (2) asthma; (3) GERD; (4) chronic bronchitis from cigarette smoking or other inhaled environmental irritants; (5) nonasthmatic eosinophilic bronchitis; and (6) bronchiectasis.   These conditions, singly or in combination, have accounted for up to 94% of the causes of chronic cough in prospective studies.   Other conditions have constituted no >6% of the causes in prospective studies These have included bronchogenic carcinoma, chronic interstitial pneumonia, sarcoidosis, left ventricular failure, ACEI-induced cough, and aspiration from a condition associated with pharyngeal dysfunction.   .Chronic cough is often simultaneously caused by more than one condition. A single cause has been found from 38 to 82% of the time, multiple causes from 18 to 62%. Multiply caused cough has been the result of three diseases up to 42% of the time.     Very likely he does have a mild tracheitis from work exposures ? Developing anthracotic changes in trachea but Symptoms are markedly disproportionate to objective findings and not clear this is even  lung problem but pt does appear to have difficult airway management issues. DDX of  difficult airways managment all start with A and  include Adherence, Ace Inhibitors, Acid Reflux, Active Sinus Disease, Alpha 1 Antitripsin deficiency, Anxiety masquerading as Airways dz,  ABPA,  allergy(esp in young), Aspiration (esp in elderly), Adverse effects of DPI,  Active smokers, plus two Bs  = Bronchiectasis and Beta blocker use..and one C= CHF   Acid reflux would be at the top of the list, not necessarily as a cause of the original problem but certainly preventing the airway from healing via a cyclical pattern so rec suppress cough and rx with GERD meds/  diet then regroup.    FOB would likely make the cough worse at this point and is not indicated unless symptoms remain refractory  See instructions for specific recommendations which were reviewed directly with the patient who was given a copy with highlighter outlining the key components.

## 2011-08-24 ENCOUNTER — Encounter: Payer: Self-pay | Admitting: Family Medicine

## 2011-08-24 DIAGNOSIS — J387 Other diseases of larynx: Secondary | ICD-10-CM | POA: Insufficient documentation

## 2011-08-24 DIAGNOSIS — M479 Spondylosis, unspecified: Secondary | ICD-10-CM | POA: Insufficient documentation

## 2011-08-24 DIAGNOSIS — K760 Fatty (change of) liver, not elsewhere classified: Secondary | ICD-10-CM

## 2011-08-24 HISTORY — DX: Fatty (change of) liver, not elsewhere classified: K76.0

## 2011-08-24 NOTE — Assessment & Plan Note (Signed)
Discussed importance of limiting liver insults, also needs low fat/low chol diet.  Will monitor LFTs periodically. Lab Results  Component Value Date   CHOL 142 08/09/2011   HDL 39* 08/09/2011   LDLCALC 79 08/09/2011   TRIG 118 08/09/2011   CHOLHDL 3.6 08/09/2011   Lab Results  Component Value Date   ALT 70* 08/09/2011   AST 47* 08/09/2011   ALKPHOS 97 08/09/2011   BILITOT 1.1 08/09/2011

## 2011-08-24 NOTE — Progress Notes (Signed)
OFFICE NOTE  08/24/2011  CC:  Chief Complaint  Patient presents with  . Follow-up    RLS, pain     HPI: Patient is a 61 y.o. Caucasian male who is here for f/u RLS, musculoskeletal pain, hoarse/gravely voice, recently detected low testosterone in the setting of fatigue and excessive wt gain. RLS much improved taking clonazepam hs, 1mg .  No side effects. Saw Dr. Haroldine Laws, ENT, again yesterday and laryngoscopy improved but he still has suspicion of damage from inhalant at pt's work, referred him to Dr. Sherene Sires for further pulm eval. We reviewed multiple x-ray results from last visit--C and L spine with facet arthritic changes, some foraminal narrowing.  Abd u/s showed some fatty liver but no hepatomegaly.  CXR showed no acute changes but showed some mild chronic increased interstitial markings. We had planned on setting up sleep study but he feels rested now since RLS controlled on clonaz and wants to cancel plans for this.  Discussed testosterone level, need to repeat. Tramadol has been helpful for his musculoskeletal pains.  Pertinent PMH:  Past Medical History  Diagnosis Date  . Hereditary essential tremor   . History of nephrolithiasis   . DDD (degenerative disc disease), lumbar   . Hypogonadism, male 08/10/2011  . Restless legs syndrome 08/09/2011  . Fatty liver disease, nonalcoholic 08/24/2011   Past Surgical History  Procedure Date  . Lumbar spine surgery 2002    ruptured disk  . Inguinal hernia repair 2004  and  2005    bilateral  . Colonoscopy 2007    normal per pt report   Past family and social history reviewed and there are no changes since the patient's last office visit with me.  MEDS:  Outpatient Prescriptions Prior to Visit  Medication Sig Dispense Refill  . clonazePAM (KLONOPIN) 1 MG tablet 1/2-1 tab po at bedtime for restless legs syndrome  30 tablet  1  . ibuprofen (ADVIL,MOTRIN) 200 MG tablet Take 600 mg by mouth every 6 (six) hours as needed. pain       .  traMADol (ULTRAM) 50 MG tablet 1-2 tabs po bid prn pain  30 tablet  1    PE: Blood pressure 151/93, pulse 65, height 6' (1.829 m), weight 257 lb (116.574 kg). Gen: Alert, well appearing.  Patient is oriented to person, place, time, and situation. No further exam today.  IMPRESSION AND PLAN: Restless legs syndrome Much improved.  Continue clonazepam 1mg  qhs. Ferritin and Hb normal 07/2011.  Spondylosis Cervical (with left arm radiculopathy) and lumbar. PT referral discussed but pt has a relative who is a PT who will try doing PT with him first, and if this ends up being inadequate/inconvenient then we'll make formal PT referral [facility]).  Tramadol 50-100mg  q6h prn pain.  Laryngeal disorder Improved mildly per Dr. Allayne Stack recent re-eval. Pulmonologist, Dr. Sherene Sires to further eval for lung injury (?inhalant? ?chemical? At his job).  Hypogonadism, male Will repeat level today to compare to prior level, get baseline avg. Since his fatigue/EDS is improving with treatment of his RLS, we may choose not to replace testosterone at this time.  Fatty liver disease, nonalcoholic Discussed importance of limiting liver insults, also needs low fat/low chol diet.  Will monitor LFTs periodically. Lab Results  Component Value Date   CHOL 142 08/09/2011   HDL 39* 08/09/2011   LDLCALC 79 08/09/2011   TRIG 118 08/09/2011   CHOLHDL 3.6 08/09/2011   Lab Results  Component Value Date   ALT 70* 08/09/2011  AST 47* 08/09/2011   ALKPHOS 97 08/09/2011   BILITOT 1.1 08/09/2011     Fatigue Improved some with treatment of RLS/improved sleep, but still I think pt is deconditioned. Encouraged him to pick up his activity level/exercise.  Vit B12 level recently normal, TSH normal.      FOLLOW UP:  6 mo

## 2011-08-24 NOTE — Assessment & Plan Note (Signed)
Will repeat level today to compare to prior level, get baseline avg. Since his fatigue/EDS is improving with treatment of his RLS, we may choose not to replace testosterone at this time.

## 2011-08-24 NOTE — Assessment & Plan Note (Signed)
Improved some with treatment of RLS/improved sleep, but still I think pt is deconditioned. Encouraged him to pick up his activity level/exercise.  Vit B12 level recently normal, TSH normal.

## 2011-08-24 NOTE — Assessment & Plan Note (Signed)
Improved mildly per Dr. Allayne Stack recent re-eval. Pulmonologist, Dr. Sherene Sires to further eval for lung injury (?inhalant? ?chemical? At his job).

## 2011-08-24 NOTE — Assessment & Plan Note (Addendum)
Much improved.  Continue clonazepam 1mg  qhs. Ferritin and Hb normal 07/2011.

## 2011-08-24 NOTE — Assessment & Plan Note (Signed)
Cervical (with left arm radiculopathy) and lumbar. PT referral discussed but pt has a relative who is a PT who will try doing PT with him first, and if this ends up being inadequate/inconvenient then we'll make formal PT referral [facility]).  Tramadol 50-100mg  q6h prn pain.

## 2011-09-01 ENCOUNTER — Institutional Professional Consult (permissible substitution): Payer: BC Managed Care – PPO | Admitting: Internal Medicine

## 2011-09-01 DIAGNOSIS — R0600 Dyspnea, unspecified: Secondary | ICD-10-CM | POA: Insufficient documentation

## 2011-09-01 NOTE — Assessment & Plan Note (Signed)
-   08/20/2011  Walked RA x 3 laps @ 185 ft each stopped due to end of study, no desat   - Spirometry 08/20/11 FEV1 1.60 (67%) Ratio 87  Restrictive changes typical of body habitus, can't r/o ild but s desat unlikely to be contributing much to sob vs effects of obesity.  Needs formal pft's and dlco next

## 2011-09-05 ENCOUNTER — Encounter: Payer: Self-pay | Admitting: Family Medicine

## 2011-09-06 ENCOUNTER — Telehealth: Payer: Self-pay | Admitting: Family Medicine

## 2011-09-06 NOTE — Telephone Encounter (Signed)
Rec'd fax from [facility]. They have not been able to contact patient to set up CPAP

## 2011-09-13 ENCOUNTER — Encounter: Payer: Self-pay | Admitting: Family Medicine

## 2011-10-07 ENCOUNTER — Other Ambulatory Visit: Payer: Self-pay | Admitting: Urology

## 2011-10-07 ENCOUNTER — Encounter (HOSPITAL_COMMUNITY): Payer: Self-pay | Admitting: Pharmacy Technician

## 2011-10-07 NOTE — H&P (Signed)
[name] presents today as an acute walk-in. He has been a longstanding patient of Dr. Annabell Howells. I did see him on one occasion as well back in 2011. Micheal Guerra tells me he has been having some intermittent right-sided flank discomfort for about a month. He has continued to pass stones since his last visit here. He brings with him in a container what appears to be a 6-7 mm stone that he has passed within the last several weeks. His pain really intensified today. He has not had any gross hematuria or other significant systemic complaints. The patient's last imaging studies were done approximately a year ago and showed significant bilateral stone burden. At that time he also had a 7 mm distal ureteral stone which he did subsequently pass. The patient urinalysis today only shows a small amount of blood. He clearly appears to be in at least moderate distress having what appears to be fairly classic right-sided renal colic.      Past Medical History Problems  1. History of  Esophageal Reflux 530.81 2. History of  Hematoma Of The Right Kidney 866.01 3. History of  Nephrolithiasis V13.01 4. History of  Proximal Ureteral Stone On The Left 592.1  Surgical History Problems  1. History of  Back Surgery 2. History of  Cystoscopy With Ureteroscopy Left 3. History of  Inguinal Hernia Repair 4. History of  Lithotripsy  Current Meds 1. No Reported Medications  Allergies Medication  1. No Known Drug Allergies  Family History Problems  1. Maternal history of  Breast Cancer V16.3 2. Sororal history of  Diabetes Mellitus V18.0 3. Paternal grandfather's history of  Diabetes Mellitus V18.0 4. Maternal history of  Family Health Status Number Of Children 1 daughter 5. Family history of  Family Health Status Number Of Children 1 daughter 50. Paternal history of  Heart Disease V17.49 7. Paternal history of  Hematuria 8. Paternal history of  Nephrolithiasis  Social History Problems  1. Caffeine  Use 2 per day 2. Marital History - Currently Married 3. Never A Smoker 4. 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 Alcohol Use 6. Tobacco Use  Review of Systems Genitourinary, constitutional, skin, eye, otolaryngeal, hematologic/lymphatic, cardiovascular, pulmonary, endocrine, musculoskeletal, gastrointestinal, neurological and psychiatric system(s) were reviewed and pertinent findings if present are noted.  Genitourinary: feelings of urinary urgency, but no hematuria.  Gastrointestinal: flank pain, abdominal pain and heartburn.  Constitutional: feeling tired (fatigue).  ENT: sore throat.  Respiratory: shortness of breath and cough.  Musculoskeletal: back pain and joint pain.  Neurological: headache.    Vitals Vital Signs [Data Includes: Last 1 Day]  26Mar2013 04:17PM  BMI Calculated: 34.54 BSA Calculated: 2.36 Height: 6 ft  Weight: 255 lb  Blood Pressure: 154 / 91 Temperature: 97.9 F Heart Rate: 64  Physical Exam Constitutional:. In acute distress.  Abdomen: The abdomen is soft and nontender. No masses are palpated. No CVA tenderness. No hernias are palpable. No hepatosplenomegaly noted.    Results/Data Urine [Data Includes: Last 1 Day]   26Mar2013  COLOR YELLOW   APPEARANCE CLEAR   SPECIFIC GRAVITY 1.025   pH 5.5   GLUCOSE NEG mg/dL  BILIRUBIN NEG   KETONE NEG mg/dL  BLOOD SMALL   PROTEIN NEG mg/dL  UROBILINOGEN 1 mg/dL  NITRITE NEG   LEUKOCYTE ESTERASE NEG   SQUAMOUS EPITHELIAL/HPF RARE   WBC NONE SEEN WBC/hpf  RBC 0-3 RBC/hpf  BACTERIA NONE SEEN   CRYSTALS NONE SEEN   CASTS NONE SEEN    Assessment  Assessed  1. Nephrolithiasis Of Both Kidneys 592.0 2. Renal Colic 788.0 3. Ureteral Stone 592.1  Plan Health Maintenance (V70.0)  1. UA With REFLEX  Done: 26Mar2013 04:06PM Renal Colic (788.0)  2. AU CT-STONE PROTOCOL  Requested for: 26Mar2013 Ureteral Stone (592.1)  3. Follow-up Schedule Surgery Office  Follow-up  Requested for:  26Mar2013  Discussion/Summary   [name] underwent a stone protocol CT today. The official report remains pending. I have reviewed the images however. The patient does have bilateral stone burden. He clearly does have right-sided hydronephrosis and what appears to be a 10-11 mm stone in his proximal ureter on the right side. This stone has an exceedingly low likelihood of spontaneous passage, although he has passed some fairly impressive stones in the past. Treatment options would include primarily lithotripsy versus ureteroscopy. Given the stone size and location, I do think lithotripsy would be the best option for him. Unfortunately, there is no lithotriptor available this week and next week with many physicians gone, it is unclear what availability there will be. Clearly, his situation may need to be temporized with double-J stent placement followed by lithotripsy at another time. We will need to see who can facilitate this for Micheal Guerra. It is possible that we could find someone to put a stent in him early next week and/or potentially definitively treat the stone. We will need to check with Dr. Belva Crome schedule as well, as well as the physicians who are going to be here next week. In the interim, Toradol will be give today as well as pain medication. It is unclear whether this stone really just became obstructing today or whether it is the same stone and pain that he has been experiencing over the last several weeks.

## 2011-10-08 ENCOUNTER — Encounter (HOSPITAL_COMMUNITY): Payer: Self-pay | Admitting: *Deleted

## 2011-10-08 NOTE — Progress Notes (Signed)
Patient instructed to read every page in [place] folder fill out history form prior to arrival . Follow Restricted medication list No aspirin, vitamins, ibuprofen, herbals . Read list carefully. Plenty of fluids day prior to procedure and Laxative as directed by doctor Light dinner. Must have driver and to stay during procedure.

## 2011-10-11 ENCOUNTER — Ambulatory Visit (HOSPITAL_COMMUNITY): Payer: BC Managed Care – PPO

## 2011-10-11 ENCOUNTER — Ambulatory Visit (HOSPITAL_COMMUNITY)
Admission: RE | Admit: 2011-10-11 | Discharge: 2011-10-11 | Disposition: A | Discharge status: Home / Self Care | Payer: BC Managed Care – PPO | Source: Ambulatory Visit | Attending: Urology | Admitting: Urology

## 2011-10-11 ENCOUNTER — Encounter (HOSPITAL_COMMUNITY): Payer: Self-pay | Admitting: *Deleted

## 2011-10-11 ENCOUNTER — Encounter (HOSPITAL_COMMUNITY)
Admission: RE | Disposition: A | Discharge status: Home / Self Care | Payer: Self-pay | Source: Ambulatory Visit | Attending: Urology

## 2011-10-11 DIAGNOSIS — N201 Calculus of ureter: Secondary | ICD-10-CM | POA: Insufficient documentation

## 2011-10-11 DIAGNOSIS — K449 Diaphragmatic hernia without obstruction or gangrene: Secondary | ICD-10-CM | POA: Insufficient documentation

## 2011-10-11 HISTORY — DX: Calculus of kidney: N20.0

## 2011-10-11 SURGERY — LITHOTRIPSY, ESWL
Anesthesia: LOCAL | Laterality: Right

## 2011-10-11 MED ORDER — DIPHENHYDRAMINE HCL 25 MG PO CAPS
25.0000 mg | ORAL_CAPSULE | ORAL | Status: AC
  Administered 2011-10-11: 25 mg via ORAL

## 2011-10-11 MED ORDER — CIPROFLOXACIN HCL 500 MG PO TABS
500.0000 mg | ORAL_TABLET | ORAL | Status: AC
  Administered 2011-10-11: 500 mg via ORAL

## 2011-10-11 MED ORDER — DIAZEPAM 5 MG PO TABS
10.0000 mg | ORAL_TABLET | ORAL | Status: AC
  Administered 2011-10-11: 10 mg via ORAL

## 2011-10-11 MED ORDER — DEXTROSE-NACL 5-0.45 % IV SOLN
INTRAVENOUS | Status: DC
  Administered 2011-10-11: 17:00:00 via INTRAVENOUS

## 2011-10-11 NOTE — Interval H&P Note (Signed)
History and Physical Interval Note:  10/11/2011 4:58 PM  Micheal Guerra  has presented today for surgery, with the diagnosis of right renal stone  The various methods of treatment have been discussed with the patient and family. After consideration of risks, benefits and other options for treatment, the patient has consented to  Procedure(s) (LRB): EXTRACORPOREAL SHOCK WAVE LITHOTRIPSY (ESWL) (Right) as a surgical intervention .  The patients' history has been reviewed, patient examined, no change in status, stable for surgery.  I have reviewed the patients' chart and labs.  Questions were answered to the patient's satisfaction.     Ingrid Shifrin J

## 2011-10-11 NOTE — Progress Notes (Signed)
Patient voided post litho. VSS. Verbalizes understanding of d/c instructions. Home with wife at 10. Has urine strainer and collection cup.

## 2011-10-11 NOTE — Discharge Instructions (Signed)
Lithotripsy for Kidney Stones  WHAT ARE KIDNEY STONES?  The kidneys filter blood for chemicals the body cannot use. These waste chemicals are eliminated in the urine. They are removed from the body. Under some conditions, these chemicals may become concentrated. When this happens, they form crystals in the urine. When these crystals build up and stick together, stones may form. When these stones block the flow of urine through the urinary tract, they may cause severe pain. The urinary tract is very sensitive to blockage and stretching by the stone.  WHAT IS LITHOTRIPSY?  Lithotripsy is a treatment that can sometimes help eliminate kidney stones and pain faster. A form of lithotripsy, also known as ESWL (extracorporeal shock wave lithotripsy), is a nonsurgical procedure that helps your body rid itself of the kidney stone with a minimum amount of pain. EWSL is a method of crushing a kidney stone with shock waves. These shock waves pass through your body. They cause the kidney stones to crumble while still in the urinary tract. It is then easier for the smaller pieces of stone to pass in the urine.  Lithotripsy usually takes about an hour. It is done in a hospital, a lithotripsy center, or a mobile unit. It usually does not require an overnight stay. Your caregiver will instruct you on preparation for the procedure. Your caregiver will tell you what to expect afterward.  LET YOUR CAREGIVER KNOW ABOUT:   Allergies.    Medicines taken including herbs, eye drops, over the counter medicines (including aspirin, aleve, or motrin for treatment of inflammatory conditions) and creams.    Use of steroids (by mouth or creams).    Previous problems with anesthetics or novocaine.    Possibility of pregnancy, if this applies.    History of blood clots (thrombophlebitis).    History of bleeding or blood problems.    Previous surgery.    Other health problems.   RISKS AND COMPLICATIONS   Complications of lithotripsy are uncommon, but include the following:   Infection.    Bleeding of the kidney.    Bruising of the kidney or skin.    Obstruction of the ureter (the passageway from the kidney to the bladder).    Failure of the stone to fragment (break apart).   PROCEDURE  A stent (flexible tube with holes) may be placed in your ureter. The ureter is the tube that transports the urine from the kidneys to the bladder. Your caregiver may place a stent before the procedure. This will help keep urine flowing from the kidney if the fragments of the stone block the ureter. You may receive an intravenous (IV) line to give you fluids and medicines. These medicines may help you relax or make you sleep. During the procedure, you will lie comfortably on a fluid-filled cushion or in a warm-water bath. After an x-ray or ultrasound locates your stone, shock waves are aimed at the stone. If you are awake, you may feel a tapping sensation (feeling) as the shock waves pass through your body. If large stone particles remain after treatment, a second procedure may be necessary at a later date.  For comfort during the test:   Relax as much as possible.    Try to remain still as much as possible.    Try to follow instructions to speed up the test.    Let your caregiver know if you are uncomfortable, anxious, or in pain.   AFTER THE PROCEDURE    After surgery, you will be   taken to the recovery area. A nurse will watch and check your progress. Once you're awake, stable, and taking fluids well, you will be allowed to go home as long as there are no problems. You may be prescribed antibiotics (medicines that kill germs) to help prevent infection. You may also be prescribed pain medicine if needed. In a week or two, your doctor may remove your stent, if you have one. Your caregiver will check to see whether or not stone particles remain.  PASSING THE STONE   It may take anywhere from a day to several weeks for the stone particles to leave your body. During this time, drink at least 8 to 12 eight ounce glasses of water every day. It is normal for your urine to be cloudy or slightly bloody for a few weeks following this procedure. You may even see small pieces of stone in your urine. A slight fever and some pain are also normal. Your caregiver may ask you to strain your urine to collect some stone particles for chemical analysis. If you find particles while straining the urine, save them. Analysis tells you and the caregiver what the stone is made of. Knowing this may help prevent future stones.  PREVENTING FUTURE STONES   Drink about 8 to 12, eight-ounce glasses of water every day.    Follow the diet your caregiver recommends.    Take your prescribed medicine.    See your caregiver regularly for checkups.   SEEK IMMEDIATE MEDICAL CARE IF:   You develop an oral temperature above 102 F (38.9 C), or as your caregiver suggests.    Your pain is not relieved by medicine.    You develop nausea (feeling sick to your stomach) and vomiting.    You develop heavy bleeding.    You have difficulty urinating.   Document Released: 06/25/2000 Document Revised: 06/17/2011 Document Reviewed: 04/19/2008  [employer] Patient Information 2012 [employer].

## 2011-10-11 NOTE — Progress Notes (Signed)
Took laxative yesterday with results. Denies aspirin or blood thinners.

## 2011-10-14 ENCOUNTER — Other Ambulatory Visit: Payer: Self-pay | Admitting: *Deleted

## 2011-10-14 MED ORDER — CLONAZEPAM 1 MG PO TABS: ORAL_TABLET | ORAL | Status: DC

## 2011-10-14 NOTE — Telephone Encounter (Signed)
RX faxed

## 2011-10-14 NOTE — Telephone Encounter (Signed)
Faxed refill request received from pharmacy for clonazepam Last filled by MD on 08/09/11, #30 x 1 Last seen on 08/19/11 Follow up in 6 months. Please advise refills.

## 2011-10-29 ENCOUNTER — Other Ambulatory Visit: Payer: Self-pay | Admitting: Urology

## 2011-10-29 ENCOUNTER — Ambulatory Visit (HOSPITAL_COMMUNITY)
Admission: AD | Admit: 2011-10-29 | Discharge: 2011-10-29 | Disposition: A | Discharge status: Home / Self Care | Payer: BC Managed Care – PPO | Source: Ambulatory Visit | Attending: Urology | Admitting: Urology

## 2011-10-29 ENCOUNTER — Encounter (HOSPITAL_COMMUNITY): Payer: Self-pay | Admitting: *Deleted

## 2011-10-29 ENCOUNTER — Encounter (HOSPITAL_COMMUNITY): Payer: Self-pay | Admitting: Anesthesiology

## 2011-10-29 ENCOUNTER — Encounter (HOSPITAL_COMMUNITY)
Admission: AD | Disposition: A | Discharge status: Home / Self Care | Payer: Self-pay | Source: Ambulatory Visit | Attending: Urology

## 2011-10-29 ENCOUNTER — Ambulatory Visit (HOSPITAL_COMMUNITY): Payer: BC Managed Care – PPO | Admitting: Anesthesiology

## 2011-10-29 DIAGNOSIS — G250 Essential tremor: Secondary | ICD-10-CM | POA: Insufficient documentation

## 2011-10-29 DIAGNOSIS — G252 Other specified forms of tremor: Secondary | ICD-10-CM | POA: Insufficient documentation

## 2011-10-29 DIAGNOSIS — N201 Calculus of ureter: Secondary | ICD-10-CM | POA: Insufficient documentation

## 2011-10-29 LAB — SURGICAL PCR SCREEN: MRSA, PCR: POSITIVE — AB

## 2011-10-29 SURGERY — CYSTOURETEROSCOPY, WITH RETROGRADE PYELOGRAM AND STENT INSERTION
Anesthesia: General | Laterality: Right | Wound class: Clean Contaminated

## 2011-10-29 MED ORDER — BELLADONNA ALKALOIDS-OPIUM 16.2-60 MG RE SUPP
RECTAL | Status: DC | PRN
  Administered 2011-10-29: 1 via RECTAL

## 2011-10-29 MED ORDER — LIDOCAINE HCL 2 % EX GEL
CUTANEOUS | Status: AC
  Filled 2011-10-29: qty 10

## 2011-10-29 MED ORDER — PHENAZOPYRIDINE HCL 100 MG PO TABS: 100.0000 mg | ORAL_TABLET | Freq: Three times a day (TID) | ORAL | Status: AC | PRN

## 2011-10-29 MED ORDER — MUPIROCIN 2 % EX OINT
TOPICAL_OINTMENT | CUTANEOUS | Status: AC
  Filled 2011-10-29: qty 22

## 2011-10-29 MED ORDER — PROMETHAZINE HCL 25 MG/ML IJ SOLN: 6.2500 mg | INTRAMUSCULAR | Status: DC | PRN

## 2011-10-29 MED ORDER — 0.9 % SODIUM CHLORIDE (POUR BTL) OPTIME
TOPICAL | Status: DC | PRN
  Administered 2011-10-29: 1000 mL

## 2011-10-29 MED ORDER — LACTATED RINGERS IV SOLN
INTRAVENOUS | Status: DC
  Administered 2011-10-29: 1000 mL via INTRAVENOUS

## 2011-10-29 MED ORDER — MIDAZOLAM HCL 5 MG/5ML IJ SOLN
INTRAMUSCULAR | Status: DC | PRN
  Administered 2011-10-29 (×2): 1 mg via INTRAVENOUS

## 2011-10-29 MED ORDER — CEFAZOLIN SODIUM 1-5 GM-% IV SOLN: 1.0000 g | INTRAVENOUS | Status: DC

## 2011-10-29 MED ORDER — FENTANYL CITRATE 0.05 MG/ML IJ SOLN: 25.0000 ug | INTRAMUSCULAR | Status: DC | PRN

## 2011-10-29 MED ORDER — BELLADONNA ALKALOIDS-OPIUM 16.2-60 MG RE SUPP
RECTAL | Status: AC
  Filled 2011-10-29: qty 1

## 2011-10-29 MED ORDER — CEFAZOLIN SODIUM-DEXTROSE 2-3 GM-% IV SOLR
INTRAVENOUS | Status: AC
  Filled 2011-10-29: qty 50

## 2011-10-29 MED ORDER — PROPOFOL 10 MG/ML IV EMUL
INTRAVENOUS | Status: DC | PRN
  Administered 2011-10-29: 50 mg via INTRAVENOUS
  Administered 2011-10-29: 150 mg via INTRAVENOUS

## 2011-10-29 MED ORDER — SODIUM CHLORIDE 0.9 % IR SOLN
Status: DC | PRN
  Administered 2011-10-29: 3000 mL

## 2011-10-29 MED ORDER — ACETAMINOPHEN 10 MG/ML IV SOLN
INTRAVENOUS | Status: AC
  Filled 2011-10-29: qty 100

## 2011-10-29 MED ORDER — HYOSCYAMINE SULFATE 0.125 MG PO TABS: 0.1250 mg | ORAL_TABLET | ORAL | Status: DC | PRN

## 2011-10-29 MED ORDER — SENNOSIDES-DOCUSATE SODIUM 8.6-50 MG PO TABS: 1.0000 | ORAL_TABLET | Freq: Two times a day (BID) | ORAL | Status: AC

## 2011-10-29 MED ORDER — FENTANYL CITRATE 0.05 MG/ML IJ SOLN
INTRAMUSCULAR | Status: DC | PRN
  Administered 2011-10-29: 50 ug via INTRAVENOUS
  Administered 2011-10-29: 25 ug via INTRAVENOUS
  Administered 2011-10-29 (×2): 50 ug via INTRAVENOUS
  Administered 2011-10-29: 25 ug via INTRAVENOUS
  Administered 2011-10-29: 50 ug via INTRAVENOUS

## 2011-10-29 MED ORDER — FENTANYL CITRATE 0.05 MG/ML IJ SOLN
25.0000 ug | INTRAMUSCULAR | Status: DC | PRN
  Administered 2011-10-29: 25 ug via INTRAVENOUS

## 2011-10-29 MED ORDER — ONDANSETRON HCL 4 MG/2ML IJ SOLN
INTRAMUSCULAR | Status: DC | PRN
  Administered 2011-10-29: 4 mg via INTRAVENOUS

## 2011-10-29 MED ORDER — LACTATED RINGERS IV SOLN
INTRAVENOUS | Status: DC | PRN
  Administered 2011-10-29 (×2): via INTRAVENOUS

## 2011-10-29 MED ORDER — OXYBUTYNIN CHLORIDE 5 MG PO TABS: 5.0000 mg | ORAL_TABLET | Freq: Four times a day (QID) | ORAL | Status: DC | PRN

## 2011-10-29 MED ORDER — CEFAZOLIN SODIUM 1-5 GM-% IV SOLN
INTRAVENOUS | Status: DC | PRN
  Administered 2011-10-29: 2 g via INTRAVENOUS

## 2011-10-29 MED ORDER — SODIUM CHLORIDE 0.9 % IR SOLN
Status: DC | PRN
  Administered 2011-10-29: 1000 mL

## 2011-10-29 MED ORDER — IOHEXOL 300 MG/ML  SOLN
INTRAMUSCULAR | Status: AC
  Filled 2011-10-29: qty 1

## 2011-10-29 MED ORDER — FENTANYL CITRATE 0.05 MG/ML IJ SOLN
INTRAMUSCULAR | Status: AC
  Filled 2011-10-29: qty 2

## 2011-10-29 MED ORDER — LIDOCAINE HCL (CARDIAC) 20 MG/ML IV SOLN
INTRAVENOUS | Status: DC | PRN
  Administered 2011-10-29: 75 mg via INTRAVENOUS

## 2011-10-29 MED ORDER — IOHEXOL 300 MG/ML  SOLN
INTRAMUSCULAR | Status: DC | PRN
  Administered 2011-10-29: 20 mL
  Administered 2011-10-29: 10 mL

## 2011-10-29 SURGICAL SUPPLY — 36 items
ADAPTER CATH URET PLST 4-6FR (CATHETERS) ×3 IMPLANT
BAG DRAIN URO-CYSTO SKYTR STRL (DRAIN) IMPLANT
BAG URO CATCHER STRL LF (DRAPE) ×3 IMPLANT
BASKET LASER NITINOL 1.9FR (BASKET) IMPLANT
BASKET STNLS GEMINI 4WIRE 3FR (BASKET) IMPLANT
BASKET ZERO TIP NITINOL 2.4FR (BASKET) ×3 IMPLANT
BRUSH URET BIOPSY 3F (UROLOGICAL SUPPLIES) IMPLANT
CANISTER SUCT LVC 12 LTR MEDI- (MISCELLANEOUS) IMPLANT
CATH CLEAR GEL 3F BACKSTOP (CATHETERS) IMPLANT
CATH INTERMIT  6FR 70CM (CATHETERS) IMPLANT
CATH URET 5FR 28IN CONE TIP (BALLOONS)
CATH URET 5FR 28IN OPEN ENDED (CATHETERS) ×3 IMPLANT
CATH URET 5FR 70CM CONE TIP (BALLOONS) IMPLANT
CATH URET DUAL LUMEN 6-10FR 50 (CATHETERS) ×3 IMPLANT
CLOTH BEACON ORANGE TIMEOUT ST (SAFETY) ×3 IMPLANT
DRAPE CAMERA CLOSED 9X96 (DRAPES) ×3 IMPLANT
ELECT REM PT RETURN 9FT ADLT (ELECTROSURGICAL)
ELECTRODE REM PT RTRN 9FT ADLT (ELECTROSURGICAL) IMPLANT
GLOVE ECLIPSE 7.0 STRL STRAW (GLOVE) ×3 IMPLANT
GLOVE INDICATOR 7.5 STRL GRN (GLOVE) IMPLANT
GOWN PREVENTION PLUS LG XLONG (DISPOSABLE) ×3 IMPLANT
GUIDEWIRE 0.038 PTFE COATED (WIRE) IMPLANT
GUIDEWIRE ANG ZIPWIRE 038X150 (WIRE) IMPLANT
GUIDEWIRE STR DUAL SENSOR (WIRE) ×6 IMPLANT
IV NS IRRIG 3000ML ARTHROMATIC (IV SOLUTION) ×3 IMPLANT
KIT BALLIN UROMAX 15FX10 (LABEL) IMPLANT
KIT BALLN UROMAX 15FX4 (MISCELLANEOUS) IMPLANT
KIT BALLN UROMAX 26 75X4 (MISCELLANEOUS)
LASER FIBER DISP (UROLOGICAL SUPPLIES) ×3 IMPLANT
PACK CYSTO (CUSTOM PROCEDURE TRAY) ×3 IMPLANT
PACK CYSTOSCOPY (CUSTOM PROCEDURE TRAY) IMPLANT
SET HIGH PRES BAL DIL (LABEL)
SHEATH URET ACCESS 12FR/35CM (UROLOGICAL SUPPLIES) IMPLANT
SHEATH URET ACCESS 12FR/55CM (UROLOGICAL SUPPLIES) IMPLANT
STENT CONTOUR 8FR X 28 (STENTS) ×3 IMPLANT
SYRINGE IRR TOOMEY STRL 70CC (SYRINGE) IMPLANT

## 2011-10-29 NOTE — Anesthesia Postprocedure Evaluation (Signed)
  Anesthesia Post-op Note  Patient: Micheal Guerra  Procedure(s) Performed: Procedure(s) (LRB): CYSTOSCOPY WITH RETROGRADE PYELOGRAM, URETEROSCOPY AND STENT PLACEMENT (Right) HOLMIUM LASER APPLICATION ()  Patient Location: PACU  Anesthesia Type: General  Level of Consciousness: awake and alert   Airway and Oxygen Therapy: Patient Spontanous Breathing  Post-op Pain: mild  Post-op Assessment: Post-op Vital signs reviewed, Patient's Cardiovascular Status Stable, Respiratory Function Stable, Patent Airway and No signs of Nausea or vomiting  Post-op Vital Signs: stable  Complications: No apparent anesthesia complications

## 2011-10-29 NOTE — Brief Op Note (Signed)
10/29/2011  5:33 PM  PATIENT:  Micheal Guerra  61 y.o. male  PRE-OPERATIVE DIAGNOSIS:  right distal ureteral stone fragments  POST-OPERATIVE DIAGNOSIS:  right distal ureteral stone fragments  PROCEDURE:  Procedure(s) (LRB): CYSTOSCOPY WITH RETROGRADE PYELOGRAM, URETEROSCOPY AND STENT PLACEMENT (Right) HOLMIUM LASER APPLICATION ()  SURGEON:  Surgeon(s) and Role:    * Milford Cage, MD - Primary  PHYSICIAN ASSISTANT:   ASSISTANTS: none   ANESTHESIA:   general  EBL:  Total I/O In: 1000 [I.V.:1000] Out: -   BLOOD ADMINISTERED:none  DRAINS: none   LOCAL MEDICATIONS USED:  LIDOCAINE  and Amount: 10 ml urethral jelly.  SPECIMEN:  Source of Specimen:  right ureter stone  DISPOSITION OF SPECIMEN:  Given to family.  COUNTS:  YES  TOURNIQUET:  * No tourniquets in log *  DICTATION: .Other Dictation: Dictation Number [id number]  PLAN OF CARE: Discharge to home after PACU  PATIENT DISPOSITION:  PACU - hemodynamically stable.   Delay start of Pharmacological VTE agent (>24hrs) due to surgical blood loss or risk of bleeding: no

## 2011-10-29 NOTE — Anesthesia Preprocedure Evaluation (Addendum)
Anesthesia Evaluation  Patient identified by MRN, date of birth, ID band Patient awake  General Assessment Comment:Problem list includes fatigue, restless legs, fatty liver, abnormal ekg, laryngeal nodule, dyspnea  Reviewed: Allergy & Precautions, H&P , NPO status , Patient's Chart, lab work & pertinent test results  Airway Mallampati: II TM Distance: >3 FB Neck ROM: Full    Dental No notable dental hx.    Pulmonary shortness of breath,  CXR: 07/27/11 : No acute disease breath sounds clear to auscultation  Pulmonary exam normal       Cardiovascular negative cardio ROS  Rhythm:Regular Rate:Normal  ECG: poor R-wave progression versus pulmonary disease pattern   Neuro/Psych negative neurological ROS  negative psych ROS   GI/Hepatic negative GI ROS, Fatty liver.   Endo/Other  negative endocrine ROS  Renal/GU negative Renal ROS  negative genitourinary   Musculoskeletal negative musculoskeletal ROS (+)   Abdominal (+) + obese,   Peds negative pediatric ROS (+)  Hematology negative hematology ROS (+)   Anesthesia Other Findings   Reproductive/Obstetrics negative OB ROS                          Anesthesia Physical Anesthesia Plan  ASA: II  Anesthesia Plan: General   Post-op Pain Management:    Induction: Intravenous  Airway Management Planned: LMA  Additional Equipment:   Intra-op Plan:   Post-operative Plan: Extubation in OR  Informed Consent: I have reviewed the patients History and Physical, chart, labs and discussed the procedure including the risks, benefits and alternatives for the proposed anesthesia with the patient or authorized representative who has indicated his/her understanding and acceptance.   Dental advisory given  Plan Discussed with: CRNA  Anesthesia Plan Comments:         Anesthesia Quick Evaluation

## 2011-10-29 NOTE — Transfer of Care (Signed)
Immediate Anesthesia Transfer of Care Note  Patient: Micheal Guerra  Procedure(s) Performed: Procedure(s) (LRB): CYSTOSCOPY WITH RETROGRADE PYELOGRAM, URETEROSCOPY AND STENT PLACEMENT (Right) HOLMIUM LASER APPLICATION ()  Patient Location: PACU  Anesthesia Type: General  Level of Consciousness: awake, alert  and oriented  Airway & Oxygen Therapy: Patient Spontanous Breathing and Patient connected to face mask oxygen  Post-op Assessment: Report given to PACU RN, Post -op Vital signs reviewed and stable and Patient moving all extremities X 4  Post vital signs: Reviewed and stable  Complications: No apparent anesthesia complications

## 2011-10-29 NOTE — H&P (Signed)
Urology History and Physical Exam  CC: right ureteral Stein[address]  HPI: 61 year old male status post shockwave lithotripsy for right renal stone October 11, 2011. Patient presents to clinic today with right lower quadrant pain. This is been present since his shockwave lithotripsy. He denies fever. He has had gross hematuria. He denies emesis or nausea. He also has dysuria. KUB today in clinic shows right-sided Steinstrasse. He's been evaluated for this earlier this week on October 25, 2011.  Today we discussed treatment options which include continued observation versus surgical intervention. Shockwave lithotripsy is not possible today as we do not have the truck present. Other options would be ureteroscopy. We discussed the risks, benefits, alternatives, and likely to be cheating goals. I explained to him that I would try my best to remove the stones but if this was not able to be done safely then I would place a ureteral stent for an interval ureteroscopy to be done.  PMH: Past Medical History  Diagnosis Date  . Hereditary essential tremor   . History of nephrolithiasis   . DDD (degenerative disc disease), lumbar   . Hypogonadism, male 08/10/2011  . Restless legs syndrome 08/09/2011  . Fatty liver disease, nonalcoholic 08/24/2011  . Laryngeal nodule 07/2011  . Kidney stones     multiple times    PSH: Past Surgical History  Procedure Date  . Lumbar spine surgery 04/2002    ruptured disk  . Inguinal hernia repair 2004  and  2005    bilateral  . Colonoscopy 2007    normal per pt report  . Cystoscopy w/ ureteroscopy w/ lithotripsy 02/2003    Left side (Dr. Annabell Howells)  . Ureteral stent placement 2011    Allergies: No Known Allergies  Medications: No prescriptions prior to admission     Social History: History   Social History  . Marital Status: Married    Spouse Name: N/A    Number of Children: N/A  . Years of Education: N/A   Occupational History  . Not on file.   Social  History Main Topics  . Smoking status: Never Smoker   . Smokeless tobacco: Former Neurosurgeon    Quit date: 06/12/2011  . Alcohol Use: No  . Drug Use: No  . Sexually Active: Not on file   Other Topics Concern  . 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    Family History: Family History  Problem Relation Age of Onset  . Cancer Mother     breast  . Heart disease Father     CAD/MI in his 58s  . Hypertension Father   . Diabetes Sister     Review of Systems: Positive: Abdominal pain, gross hematuria, arthritis, BLE numbness, back pain. Negative: Chest pain, SOB, fever..  A further 10 point review of systems was negative except what is listed in the HPI.  Physical Exam:  General: No acute distress.  Awake. Head:  Normocephalic.  Atraumatic. ENT:  EOMI.  Mucous membranes moist Neck:  Supple.  No lymphadenopathy. CV:  S1 present. S2 present. Regular rate. Pulmonary: Equal effort bilaterally.  Clear to auscultation bilaterally. Abdomen: Soft.  Mildly tender to palpation. Skin:  Normal turgor.  No visible rash. Extremity: No gross deformity of bilateral upper extremities.  No gross deformity of    bilateral lower extremities. Neurologic: Alert. Appropriate mood.   Studies:  No results found for this basename: HGB:2,WBC:2,PLT:2 in the last 72 hours  No results found for this basename: NA:2,K:2,CL:2,CO2:2,BUN:2,CREATININE:2,CALCIUM:2,MAGNESIUM:2,GFRNONAA:2,GFRAA:2 in the last 72 hours   No results found for this basename: PT:2,INR:2,APTT:2 in the last 72 hours   No components found with this basename: ABG:2    Assessment:  Right distal ureter Steinstrasse  Plan: To the OR for cystoscopy,  right ureteroscopy, laser lithotripsy, possible right ureteral stent placement.

## 2011-10-29 NOTE — Discharge Instructions (Signed)
DISCHARGE INSTRUCTIONS FOR KIDNEY STONES OR URETERAL STENT  MEDICATIONS:   1. DO NOT RESUME YOUR ASPIRIN, or any other medicines like ibuprofen, motrin, excedrin, advil, aleve, vitamin E, fish oil as these can all cause bleeding x 7 days.  2. Resume all your other meds from home - except do not take any other pain meds that you may have at home.  ACTIVITY 1. No strenuous activity x 1week 2. No driving while on narcotic pain medications 3. Drink plenty of water 4. Continue to walk at home - you can still get blood clots when you are at home, so keep active, but don't over do it. 5. May return to work in 3 days.  BATHING 1. You can shower. 2. If you have a string coming from your urethra:  The stent string is attached to your ureteral stent.  Do not pull on this.  If the stent gets pulled our partially before it is time to remove it, go ahead and remove the entire stent.  Call if you develop significant pain that lasts more than an hour.    SIGNS/SYMPTOMS TO CALL: 1. Please call us if you have a fever greater than 101.5, uncontrolled  nausea/vomiting, uncontrolled pain, dizziness, unable to urinate, chest pain, shortness of breath, leg swelling, leg pain, redness around wound, drainage from wound, or any other concerns or questions.  You can reach [place] at [phone].  FOLLOW-UP 1. If you have a string attached to your stent, you may remove it on Tuesday, 11/02/11.  To do this, pull the string until the stent is completely removed.  You may feel an odd sensation in your back.

## 2011-10-30 NOTE — Op Note (Signed)
NAME:  Micheal Guerra, Micheal Guerra NO.:  [id number]  MEDICAL RECORD NO.:  [id number]  LOCATION:  [facility]                         FACILITY:  [facility]  PHYSICIAN:  Natalia Leatherwood, MD    DATE OF BIRTH:  1950/08/14  DATE OF PROCEDURE:  10/29/2011 DATE OF DISCHARGE:  10/29/2011                              OPERATIVE REPORT   PROCEDURES PERFORMED: 1. Cystoscopy. 2. Right ureteroscopy. 3. Laser lithotripsy. 4. Basket stone retrieval. 5. Right retrograde pyelogram. 6. Right ureteral stent placement.  SURGEON:  Natalia Leatherwood, M.D.  ASSISTANT:  None.  PREOPERATIVE DIAGNOSIS:  Right distal ureteral stones.  POSTOPERATIVE DIAGNOSIS:  Right distal ureteral stones.  DRAINS:  None.  COMPLICATIONS:  None.  FINDINGS:  Impacted distal ureteral stones.  No stones noted in the proximal ureter on retrograde pyelogram with fluoroscopy.  Specimen: stones were removed and given to the patient's family.  COMPLICATIONS:  None.  HISTORY OF PRESENT ILLNESS:  A 61 year old gentleman who underwent shockwave lithotripsy and a right-sided kidney stone October 11, 2011.  He had reported back to the clinic on more than one occasion and was unable to pass the stones.  Considering he is in pain and it was experiencing stacking of the stones in distal ureter, he elected to have ureteroscopy today.  PROCEDURE:  After informed consent was obtained, the patient was taken to the operating room where he was placed in the supine position.  IV antibiotics were infused and anesthesia was induced.  He was then placed in dorsal lithotomy position, making sure to pad all pertinent neurovascular pressure points appropriately.  His genitals were then prepped and draped in usual sterile fashion.  Time-out was performed, with the correct patient, surgical site, and procedure were all identified and agreed upon by the team.  Rigid cystoscope was advanced through the urethra into the bladder.  The right ureteral  orifice was seen immediately with the impacted stone.  A sensor tip wire was placed up the right ureter with a good curl in the right renal pelvis on fluoroscopy; this was secured as a safety wire. A semi-rigid ureteroscope was then passed through the urethra to the orifice of the right ureter and a 200 micron holmium laser fiber was passed through this.  Settings were set at 0.5 joules and 20 Hz.  Lithotripsy was carried out.  It was noted that there were multiple stones in the ureter.  Lithotripsy was required to break up all the stones.  Pieces were removed with a ZeroTip Nitinol basket.  I was able to navigate the semi-rigid ureteroscope up to the level of the iliac vessels.  I was unable to advance the scope beyond this although I could see further up the ureter and did not see any stones within visualization.  Therefore, I removed the ureteroscope and placed a cystoscope and cannulated the distal ureter with a 5-French ureteral catheter.  Retrograde pyelogram was obtained when I injected contrast.  There was no extravasation from the ureter and there were no filling defects indicating any stones in the proximal ureter.  Also, noted prior to initiating retrograde pyelogram that there were no stones in the proximal ureter on fluoroscopy.    After this, I  felt that it would to be appropriate to place ureteral stent due to the amount of lithotripsy carried out.  I elected to place an 8-French 8 x 28 double-J ureteral stent with strings in place as the patient's distal ureter appeared to be somewhat stenotic and I wanted to leave a larger stent in place to where hopefully this will heal in a more open fashion.  The cystoscope was placed over the safety wire and a 8 x 24 double-J ureteral stent was placed with ease.  The strings remain in place.  The bladder was emptied.  The strings were secured the patient's penis.  A 10 mL of lidocaine jelly were placed into his urethra and a B and O  suppository was placed into his rectum.  The stones had been irrigated out of the bladder and were taken to the family.  He has placed back in the supine position. Anesthesia was reversed and taken to PACU in stable condition.  He will follow up as planned with Ashlyn, physician's assistant, and further management of stones would be carried out by his urologist, Dr. Annabell Howells.          ______________________________ Natalia Leatherwood, MD     DW/MEDQ  D:  10/29/2011  T:  10/30/2011  Job:  [id number]

## 2012-04-19 ENCOUNTER — Other Ambulatory Visit: Payer: Self-pay | Admitting: Orthopedic Surgery

## 2012-04-19 DIAGNOSIS — M545 Low back pain, unspecified: Secondary | ICD-10-CM

## 2012-04-20 ENCOUNTER — Ambulatory Visit
Admission: RE | Admit: 2012-04-20 | Discharge: 2012-04-20 | Disposition: A | Discharge status: Home / Self Care | Payer: BC Managed Care – PPO | Source: Ambulatory Visit | Attending: Orthopedic Surgery | Admitting: Orthopedic Surgery

## 2012-04-20 DIAGNOSIS — M545 Low back pain, unspecified: Secondary | ICD-10-CM

## 2012-04-21 ENCOUNTER — Other Ambulatory Visit: Payer: BC Managed Care – PPO

## 2012-06-07 ENCOUNTER — Other Ambulatory Visit: Payer: Self-pay | Admitting: Family Medicine

## 2012-06-07 MED ORDER — CLONAZEPAM 1 MG PO TABS: ORAL_TABLET | ORAL | Status: DC

## 2012-06-07 NOTE — Telephone Encounter (Signed)
#  30 rx'd, no additional RF until pt has routine f/u in office for RLS.-thx

## 2012-06-07 NOTE — Telephone Encounter (Signed)
Refill request for CLONAZEPAM for restless legs Last filled - 10/14/11, 30 x 5 Last seen - 08/19/11 Follow up - was due in 02/2012 Please advise refills.

## 2012-06-07 NOTE — Telephone Encounter (Signed)
RX faxed

## 2012-06-14 NOTE — Telephone Encounter (Signed)
Pt.notified

## 2012-08-01 ENCOUNTER — Encounter: Payer: Self-pay | Admitting: Family Medicine

## 2012-08-01 ENCOUNTER — Ambulatory Visit (INDEPENDENT_AMBULATORY_CARE_PROVIDER_SITE_OTHER): Payer: BC Managed Care – PPO | Admitting: Family Medicine

## 2012-08-01 VITALS — BP 148/88 | HR 71 | Temp 97.7°F | Resp 16 | Wt 243.0 lb

## 2012-08-01 DIAGNOSIS — R358 Other polyuria: Secondary | ICD-10-CM

## 2012-08-01 DIAGNOSIS — R739 Hyperglycemia, unspecified: Secondary | ICD-10-CM

## 2012-08-01 DIAGNOSIS — R7309 Other abnormal glucose: Secondary | ICD-10-CM

## 2012-08-01 DIAGNOSIS — R3589 Other polyuria: Secondary | ICD-10-CM

## 2012-08-01 DIAGNOSIS — R634 Abnormal weight loss: Secondary | ICD-10-CM

## 2012-08-01 DIAGNOSIS — R631 Polydipsia: Secondary | ICD-10-CM

## 2012-08-01 LAB — CBC WITH DIFFERENTIAL/PLATELET
Basophils Absolute: 0.1 10*3/uL (ref 0.0–0.1)
Eosinophils Absolute: 0.3 10*3/uL (ref 0.0–0.7)
Lymphocytes Relative: 26.4 % (ref 12.0–46.0)
MCHC: 33.7 g/dL (ref 30.0–36.0)
MCV: 90.8 fl (ref 78.0–100.0)
Monocytes Absolute: 0.5 10*3/uL (ref 0.1–1.0)
Neutro Abs: 5.9 10*3/uL (ref 1.4–7.7)
Neutrophils Relative %: 64.3 % (ref 43.0–77.0)
RDW: 12.4 % (ref 11.5–14.6)

## 2012-08-01 LAB — POCT URINALYSIS DIPSTICK
Bilirubin, UA: NEGATIVE
Leukocytes, UA: NEGATIVE
Nitrite, UA: NEGATIVE
Protein, UA: NEGATIVE
Urobilinogen, UA: 1
pH, UA: 5

## 2012-08-01 LAB — COMPREHENSIVE METABOLIC PANEL
ALT: 64 U/L — ABNORMAL HIGH (ref 0–53)
CO2: 27 mEq/L (ref 19–32)
Calcium: 8.8 mg/dL (ref 8.4–10.5)
Chloride: 100 mEq/L (ref 96–112)
GFR: 84.25 mL/min (ref >60.00)
Glucose, Bld: 329 mg/dL — ABNORMAL HIGH (ref 70–99)
Sodium: 133 mEq/L — ABNORMAL LOW (ref 135–145)
Total Bilirubin: 1.3 mg/dL — ABNORMAL HIGH (ref 0.3–1.2)
Total Protein: 7 g/dL (ref 6.0–8.3)

## 2012-08-01 LAB — LIPID PANEL
Cholesterol: 130 mg/dL (ref 0–200)
Triglycerides: 217 mg/dL — ABNORMAL HIGH (ref 0.0–149.0)

## 2012-08-01 NOTE — Assessment & Plan Note (Addendum)
This is new dx of DM 2. He appears well today.  Wife adamant about pt seeing Dr. Talmage Nap and basically just wanted me to make a referral to her today so I did this. I also ordered CBC, CMET, HbA1c, FLP, and UA. Will fax results to Dr. Willeen Cass office.

## 2012-08-01 NOTE — Progress Notes (Signed)
OFFICE NOTE  08/01/2012  CC:  Chief Complaint  Patient presents with  . Headache  . Diabetes    symptoms. sister diabetic, checked sugar was 300,  fasting  . dry mouth  . Weight Loss     HPI: Patient is a 62 y.o. Caucasian male who is here for recent elevated glucose. He has noted increased thirst, dry mouth, increased urination, and 8-10 lb wt loss over the last few weeks. All sx's persistent and gradually worsening.  Mild HA's lately.  His sister has DM and last night she checked his glucose and it was around 300 (random).  He is here today requesting a referral to Dr. Talmage Nap, his sister's endocrinologist.  I told him today that I am capable of seeing him for this condition and would refer him to specialist if I thought it was necessary but his wife stated in no uncertain terms today that she wanted him to see Dr. Talmage Nap, period.  In fact, they tell us that they have been told by Dr. Willeen Cass office that there is an opening on her schedule tomorrow at 1:30 pm and they would like to secure this spot.  Pertinent PMH:  Past Medical History  Diagnosis Date  . Hereditary essential tremor   . History of nephrolithiasis   . DDD (degenerative disc disease), lumbar   . Hypogonadism, male 08/10/2011  . Restless legs syndrome 08/09/2011  . Fatty liver disease, nonalcoholic 08/24/2011  . Laryngeal nodule 07/2011  . Kidney stones     multiple times   Past surgical, social, and family history reviewed and no changes noted since last office visit.  MEDS:  Ibuprofen 600mg  q6h prn, clonazepam 1mg , 1/2-1 tab po qhs prn  PE: Blood pressure 148/88, pulse 71, temperature 97.7 F (36.5 C), temperature source Temporal, resp. rate 16, weight 243 lb (110.224 kg), SpO2 95.00%. Gen: Alert, well appearing.  Patient is oriented to person, place, time, and situation. ENT: Eyes: no injection, icteris, swelling, or exudate.  EOMI, PERRLA. Nose: no drainage or turbinate edema/swelling.  No injection or focal  lesion.  Mouth: lips without lesion/swelling.  Oral mucosa pink and moist.  Oropharynx without erythema, exudate, or swelling.  CV: RRR, no m/r/g.   LUNGS: CTA bilat, nonlabored resps, good aeration in all lung fields. EXT: no clubbing, cyanosis, or edema.   LABS: CC UA today showed >1000 mg/dl glucose, small blood, otherwise normal.  IMPRESSION AND PLAN:  Hyperglycemia This is new dx of DM 2. He appears well today.  Wife adamant about pt seeing Dr. Talmage Nap and basically just wanted me to make a referral to her today so I did this. I also ordered CBC, CMET, HbA1c, FLP, and UA. Will fax results to Dr. Willeen Cass office.   An After Visit Summary was printed and given to the patient.  FOLLOW UP: prn

## 2013-05-20 ENCOUNTER — Emergency Department (HOSPITAL_BASED_OUTPATIENT_CLINIC_OR_DEPARTMENT_OTHER): Payer: BC Managed Care – PPO

## 2013-05-20 ENCOUNTER — Encounter (HOSPITAL_BASED_OUTPATIENT_CLINIC_OR_DEPARTMENT_OTHER): Payer: Self-pay | Admitting: Emergency Medicine

## 2013-05-20 ENCOUNTER — Emergency Department (HOSPITAL_BASED_OUTPATIENT_CLINIC_OR_DEPARTMENT_OTHER)
Admission: EM | Admit: 2013-05-20 | Discharge: 2013-05-20 | Disposition: A | Discharge status: Home / Self Care | Payer: BC Managed Care – PPO | Attending: Emergency Medicine | Admitting: Emergency Medicine

## 2013-05-20 DIAGNOSIS — Y929 Unspecified place or not applicable: Secondary | ICD-10-CM | POA: Insufficient documentation

## 2013-05-20 DIAGNOSIS — W19XXXA Unspecified fall, initial encounter: Secondary | ICD-10-CM

## 2013-05-20 DIAGNOSIS — R55 Syncope and collapse: Secondary | ICD-10-CM | POA: Insufficient documentation

## 2013-05-20 DIAGNOSIS — Z79899 Other long term (current) drug therapy: Secondary | ICD-10-CM | POA: Insufficient documentation

## 2013-05-20 DIAGNOSIS — IMO0002 Reserved for concepts with insufficient information to code with codable children: Secondary | ICD-10-CM | POA: Insufficient documentation

## 2013-05-20 DIAGNOSIS — Z8669 Personal history of other diseases of the nervous system and sense organs: Secondary | ICD-10-CM | POA: Insufficient documentation

## 2013-05-20 DIAGNOSIS — W11XXXA Fall on and from ladder, initial encounter: Secondary | ICD-10-CM | POA: Insufficient documentation

## 2013-05-20 DIAGNOSIS — S46911A Strain of unspecified muscle, fascia and tendon at shoulder and upper arm level, right arm, initial encounter: Secondary | ICD-10-CM

## 2013-05-20 DIAGNOSIS — E119 Type 2 diabetes mellitus without complications: Secondary | ICD-10-CM | POA: Insufficient documentation

## 2013-05-20 DIAGNOSIS — S32009A Unspecified fracture of unspecified lumbar vertebra, initial encounter for closed fracture: Secondary | ICD-10-CM

## 2013-05-20 DIAGNOSIS — Y9389 Activity, other specified: Secondary | ICD-10-CM | POA: Insufficient documentation

## 2013-05-20 DIAGNOSIS — R209 Unspecified disturbances of skin sensation: Secondary | ICD-10-CM | POA: Insufficient documentation

## 2013-05-20 DIAGNOSIS — Z8719 Personal history of other diseases of the digestive system: Secondary | ICD-10-CM | POA: Insufficient documentation

## 2013-05-20 DIAGNOSIS — S0990XA Unspecified injury of head, initial encounter: Secondary | ICD-10-CM | POA: Insufficient documentation

## 2013-05-20 DIAGNOSIS — Z8739 Personal history of other diseases of the musculoskeletal system and connective tissue: Secondary | ICD-10-CM | POA: Insufficient documentation

## 2013-05-20 DIAGNOSIS — Z87442 Personal history of urinary calculi: Secondary | ICD-10-CM | POA: Insufficient documentation

## 2013-05-20 DIAGNOSIS — Z8709 Personal history of other diseases of the respiratory system: Secondary | ICD-10-CM | POA: Insufficient documentation

## 2013-05-20 HISTORY — DX: Type 2 diabetes mellitus without complications: E11.9

## 2013-05-20 LAB — CBC WITH DIFFERENTIAL/PLATELET
Basophils Absolute: 0 10*3/uL (ref 0.0–0.1)
Basophils Relative: 1 % (ref 0–1)
Eosinophils Absolute: 0.3 10*3/uL (ref 0.0–0.7)
Eosinophils Relative: 3 % (ref 0–5)
Lymphs Abs: 1.6 10*3/uL (ref 0.7–4.0)
MCH: 30.1 pg (ref 26.0–34.0)
MCHC: 34.4 g/dL (ref 30.0–36.0)
MCV: 87.5 fL (ref 78.0–100.0)
Neutrophils Relative %: 70 % (ref 43–77)
Platelets: 172 10*3/uL (ref 150–400)
RBC: 4.81 MIL/uL (ref 4.22–5.81)
RDW: 12.8 % (ref 11.5–15.5)

## 2013-05-20 LAB — BASIC METABOLIC PANEL
Calcium: 9.5 mg/dL (ref 8.4–10.5)
GFR calc Af Amer: 73 mL/min — ABNORMAL LOW (ref >90)
GFR calc non Af Amer: 63 mL/min — ABNORMAL LOW (ref >90)
Glucose, Bld: 172 mg/dL — ABNORMAL HIGH (ref 70–99)
Potassium: 3.8 mEq/L (ref 3.5–5.1)
Sodium: 142 mEq/L (ref 135–145)

## 2013-05-20 MED ORDER — IOHEXOL 350 MG/ML SOLN
100.0000 mL | Freq: Once | INTRAVENOUS | Status: AC | PRN
  Administered 2013-05-20: 100 mL via INTRAVENOUS

## 2013-05-20 MED ORDER — SODIUM CHLORIDE 0.9 % IV SOLN
INTRAVENOUS | Status: DC
  Administered 2013-05-20: 16:00:00 via INTRAVENOUS

## 2013-05-20 MED ORDER — HYDROMORPHONE HCL PF 1 MG/ML IJ SOLN
1.0000 mg | Freq: Once | INTRAMUSCULAR | Status: AC
  Administered 2013-05-20: 1 mg via INTRAVENOUS
  Filled 2013-05-20: qty 1

## 2013-05-20 MED ORDER — ONDANSETRON HCL 4 MG/2ML IJ SOLN
4.0000 mg | Freq: Once | INTRAMUSCULAR | Status: AC
  Administered 2013-05-20: 4 mg via INTRAVENOUS
  Filled 2013-05-20: qty 2

## 2013-05-20 MED ORDER — SODIUM CHLORIDE 0.9 % IV BOLUS (SEPSIS)
250.0000 mL | Freq: Once | INTRAVENOUS | Status: AC
  Administered 2013-05-20: 250 mL via INTRAVENOUS

## 2013-05-20 MED ORDER — OXYCODONE-ACETAMINOPHEN 5-325 MG PO TABS: 1.0000 | ORAL_TABLET | Freq: Four times a day (QID) | ORAL | Status: DC | PRN

## 2013-05-20 NOTE — ED Notes (Signed)
Patient transported to CT 

## 2013-05-20 NOTE — ED Notes (Signed)
Pt fell off 3 step ladder on a tile porch; fell backward onto porch; + LOC briefly; c/o back and neck pain; c/o tingling to anterior neck

## 2013-05-20 NOTE — ED Provider Notes (Signed)
CSN: [id number]     Arrival date & time 05/20/13  1445 History  This chart was scribed for Shelda Jakes, MD by Dorothey Baseman, ED Scribe. This patient was seen in room MHT14/MHT14 and the patient's care was started at [facility].    Chief Complaint  Patient presents with  . Fall   Patient is a 62 y.o. male presenting with fall. The history is provided by the patient. No language interpreter was used.  Fall This is a new problem. The current episode started less than 1 hour ago. The problem has not changed since onset.Pertinent negatives include no chest pain, no abdominal pain, no headaches and no shortness of breath. He has tried nothing for the symptoms.   HPI Comments: DOMINION KATHAN is a 62 y.o. 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 Patient reports a small abrasion above the left eye from the drill hitting him and a small abrasion to the right hand secondary to the fall. Patient reports hitting his head with an associated, brief syncopal episode that lasted a couple of seconds, lower back pain, right shoulder pain, and paresthesias to the anterior neck. Patient reports a history of lumbar degenerative disc disease, but denies any recent changes. He denies any recent cold-like symptoms including cough, rhinorrhea, sore throat. He denies headache, leg numbness or weakness, chest pain, shortness of breath, abdominal pain, nausea, emesis, visual disturbance, dysuria, hematuria, rash, or confusion. He denies any hematologic problems or anticoagulant use. He denies any allergies to medications. Patient reports that his tetanus vaccination is UTD. Patient also reports a history of DM without complication.   PCP- Dr. Milinda Cave (Marjean Donna)   Past Medical History  Diagnosis Date  .  Hereditary essential tremor   . History of nephrolithiasis   . DDD (degenerative disc disease), lumbar   . Hypogonadism, male 08/10/2011  . Restless legs syndrome 08/09/2011  . Fatty liver disease, nonalcoholic 08/24/2011  . Laryngeal nodule 07/2011  . Kidney stones     multiple times  . Diabetes mellitus without complication    Past Surgical History  Procedure Laterality Date  . Lumbar spine surgery  04/2002    ruptured disk  . Inguinal hernia repair  2004  and  2005    bilateral  . Colonoscopy  2007    normal per pt report  . Cystoscopy w/ ureteroscopy w/ lithotripsy  02/2003    Left side (Dr. Annabell Howells)  . Ureteral stent placement  2011   Family History  Problem Relation Age of Onset  . Cancer Mother     breast  . Heart disease Father     CAD/MI in his 75s  . Hypertension Father   . Diabetes Sister    History  Substance Use Topics  . Smoking status: Never Smoker   . Smokeless tobacco: Former Neurosurgeon    Quit date: 06/12/2011  . Alcohol Use: No    Review of Systems  HENT: Negative for rhinorrhea and sore throat.   Eyes: Negative for visual disturbance.  Respiratory: Negative for cough and shortness of breath.   Cardiovascular: Negative for chest pain.  Gastrointestinal: Negative for nausea, vomiting and abdominal pain.  Genitourinary: Negative for dysuria and hematuria.  Musculoskeletal: Positive for arthralgias, back pain and myalgias.  Skin: Positive  for wound. Negative for rash.  Neurological: Positive for syncope. Negative for weakness, numbness and headaches.  Psychiatric/Behavioral: Negative for confusion.    Allergies  Review of patient's allergies indicates no known allergies.  Home Medications   Current Outpatient Rx  Name  Route  Sig  Dispense  Refill  . clonazePAM (KLONOPIN) 1 MG tablet      1/2-1 tab po at bedtime for restless legs syndrome   30 tablet   0   . EXPIRED: hyoscyamine (LEVSIN, ANASPAZ) 0.125 MG tablet   Oral   Take 1 tablet (0.125 mg  total) by mouth every 4 (four) hours as needed for cramping (bladder spasms).   40 tablet   4   . ibuprofen (ADVIL,MOTRIN) 200 MG tablet   Oral   Take 600 mg by mouth every 6 (six) hours as needed. pain         . Multiple Vitamin (MULITIVITAMIN WITH MINERALS) TABS   Oral   Take 1 tablet by mouth daily.         Marland Kitchen oxyCODONE-acetaminophen (PERCOCET/ROXICET) 5-325 MG per tablet   Oral   Take 1-2 tablets by mouth every 6 (six) hours as needed for severe pain.   30 tablet   0    BP 166/83  Pulse 82  Resp 16  SpO2 99%  Physical Exam  Nursing note and vitals reviewed. Constitutional: He is oriented to person, place, and time. He appears well-developed and well-nourished. No distress.  HENT:  Head: Normocephalic and atraumatic.  Mouth/Throat: Oropharynx is clear and moist.  Eyes: Conjunctivae and EOM are normal.  Neck: Normal range of motion. Neck supple.  Cardiovascular: Normal rate, regular rhythm, normal heart sounds and intact distal pulses.  Exam reveals no gallop and no friction rub.   No murmur heard. Right radial pulse is 2+.   Pulmonary/Chest: Effort normal and breath sounds normal. No respiratory distress. He has no wheezes. He has no rales.  Abdominal: Soft. Bowel sounds are normal. He exhibits no distension. There is no tenderness.  Musculoskeletal: Normal range of motion. He exhibits tenderness. He exhibits no edema.  No swelling to the bilateral malleoli. No tenderness to palpation to the bilateral hips. Tenderness to palpation over the right clavicle. No appreciable deformity consistent with shoulder dislocation.   Neurological: He is alert and oriented to person, place, and time. No cranial nerve deficit. He exhibits normal muscle tone. Coordination normal.  Skin: Skin is warm and dry.  5 mm abrasion on his left eyebrow area. Superficial skin tear on the dorsum of his right hand that is 3 cm long and 5 cm wide.   Psychiatric: He has a normal mood and affect. His  behavior is normal.    ED Course  Procedures (including critical care time)  Medications  0.9 %  sodium chloride infusion ( Intravenous New Bag/Given 05/20/13 1613)  ondansetron (ZOFRAN) injection 4 mg (4 mg Intravenous Given 05/20/13 1612)  HYDROmorphone (DILAUDID) injection 1 mg (1 mg Intravenous Given 05/20/13 1612)  sodium chloride 0.9 % bolus 250 mL (0 mLs Intravenous Stopped 05/20/13 1702)  iohexol (OMNIPAQUE) 350 MG/ML injection 100 mL (100 mLs Intravenous Contrast Given 05/20/13 1707)  HYDROmorphone (DILAUDID) injection 1 mg (1 mg Intravenous Given 05/20/13 1725)    DIAGNOSTIC STUDIES: Oxygen Saturation is 95% on room air, normal by my interpretation.    COORDINATION OF CARE: 3:34 PM- Will order CTs of the head, C spine, chest, and pelvis, and an x-ray of the left shoulder. Will order Omnipaque,  Zofran, Dilaudid, and IV fluids to manage symptoms. Discussed treatment plan with patient at bedside and patient verbalized agreement.    Results for orders placed during the hospital encounter of 05/20/13  CBC WITH DIFFERENTIAL      Result Value Range   WBC 8.2  4.0 - 10.5 K/uL   RBC 4.81  4.22 - 5.81 MIL/uL   Hemoglobin 14.5  13.0 - 17.0 g/dL   HCT 96.0  45.4 - 09.8 %   MCV 87.5  78.0 - 100.0 fL   MCH 30.1  26.0 - 34.0 pg   MCHC 34.4  30.0 - 36.0 g/dL   RDW 11.9  14.7 - 82.9 %   Platelets 172  150 - 400 K/uL   Neutrophils Relative % 70  43 - 77 %   Neutro Abs 5.8  1.7 - 7.7 K/uL   Lymphocytes Relative 19  12 - 46 %   Lymphs Abs 1.6  0.7 - 4.0 K/uL   Monocytes Relative 7  3 - 12 %   Monocytes Absolute 0.6  0.1 - 1.0 K/uL   Eosinophils Relative 3  0 - 5 %   Eosinophils Absolute 0.3  0.0 - 0.7 K/uL   Basophils Relative 1  0 - 1 %   Basophils Absolute 0.0  0.0 - 0.1 K/uL  BASIC METABOLIC PANEL      Result Value Range   Sodium 142  135 - 145 mEq/L   Potassium 3.8  3.5 - 5.1 mEq/L   Chloride 103  96 - 112 mEq/L   CO2 28  19 - 32 mEq/L   Glucose, Bld 172 (*) 70 - 99 mg/dL   BUN  16  6 - 23 mg/dL   Creatinine, Ser 5.62  0.50 - 1.35 mg/dL   Calcium 9.5  8.4 - 13.0 mg/dL   GFR calc non Af Amer 63 (*) >90 mL/min   GFR calc Af Amer 73 (*) >90 mL/min   Dg Shoulder Right  05/20/2013   CLINICAL DATA:  Fall  EXAM: RIGHT SHOULDER - 2+ VIEW  COMPARISON:  None.  FINDINGS: Three views of the right shoulder submitted. No acute fracture or subluxation. Extensive degenerative changes right AC joint. There is spurring of acromion. Mild degenerative changes glenohumeral joint. Mild spurring of humeral head.  IMPRESSION: No acute fracture or subluxation. Osteoarthritic changes as described above.   Electronically Signed   By: Natasha Mead M.D.   On: 05/20/2013 17:18   Ct Head Wo Contrast  05/20/2013   CLINICAL DATA:  Fall from ladder. Trauma to back of head. Positive loss of consciousness. Scalp hematoma.  EXAM: CT HEAD WITHOUT CONTRAST  CT CERVICAL SPINE WITHOUT CONTRAST  TECHNIQUE: Multidetector CT imaging of the head and cervical spine was performed following the standard protocol without intravenous contrast. Multiplanar CT image reconstructions of the cervical spine were also generated.  COMPARISON:  None.  FINDINGS: CT HEAD FINDINGS  No acute cortical infarct, hemorrhage, or mass lesion is present. A left parietal scalp hematoma is present. There is no underlying fracture. Mild mucosal thickening is present in the maxillary sinuses bilaterally. The paranasal sinuses and mastoid air cells are otherwise clear.  CT CERVICAL SPINE FINDINGS  The cervical spine is imaged from the skull base through T1. Vertebral body heights and alignment are maintained. No acute fracture or traumatic subluxation is evident. Asymmetric right-sided facet hypertrophy at C2-3 results in mild right foraminal stenosis. Bilateral foraminal narrowing is present at C3-4 with uncovertebral spurring left greater than  right. Asymmetric left-sided facet hypertrophy is present at C4-5 due to uncovertebral and facet disease.  Uncovertebral and facet disease leads to moderate foraminal narrowing bilaterally at C5-6 P the right greater than left foraminal narrowing is present at C6-7.  The soft tissues of the neck demonstrate mild heterogeneity within the thyroid without a dominant lesion. No significant adenopathy is present. The lung apices are clear.  IMPRESSION: 1. Multilevel spondylosis of the cervical spine. 2. No acute fracture or traumatic subluxation.   Electronically Signed   By: Gennette Pac M.D.   On: 05/20/2013 17:12   Ct Chest W Contrast  05/20/2013   CLINICAL DATA:  Larey Seat off a ladder. Severe back pain. Marland Kitchen  EXAM: CT CHEST, ABDOMEN, AND PELVIS WITH CONTRAST  TECHNIQUE: Multidetector CT imaging of the chest, abdomen and pelvis was performed following the standard protocol during bolus administration of intravenous contrast.  CONTRAST:  OMNIPAQUE IOHEXOL 350 MG/ML SOLN  COMPARISON:  None.  FINDINGS: CT CHEST FINDINGS  The chest wall is unremarkable. No definite rib, sternal or thoracic vertebral body fractures. No chest wall contusion is identified.  The heart is normal in size. No pericardial effusion. The aorta is normal in caliber. No dissection. The branch vessels are patent. No mediastinal or hilar mass, adenopathy or hematoma. The esophagus is grossly normal.  Examination of the lung parenchyma demonstrates no acute pulmonary findings. No pulmonary contusion, pneumothorax or pleural effusion. There is mild dependent bibasilar atelectasis. No worrisome pulmonary lesions. The tracheobronchial tree is unremarkable.  CT ABDOMEN AND PELVIS FINDINGS  The solid abdominal organs are intact. No acute injury of the liver or spleen is identified. The pancreas is normal. There is marked hydronephrosis of the right kidney due to an obstructing 11 mm UPJ calculus. Bilateral renal calculi are noted. The gallbladder is normal. No common bile duct dilatation.  The stomach, duodenum, small bowel and colon are grossly normal  without oral contrast. No free air or free abdominal/pelvic fluid. The appendix is normal.  There is a fracture of the L4 vertebral body. This involves the anterior superior aspect of the vertebral body. There is also a fracture involving the spinous process of L4. Associated moderate paraspinal hematoma. No other lumbar fractures are identified.  The bony pelvis is intact. Moderate degenerative changes involving both hips but no hip or pelvic fractures. The bladder is normal. No pelvic mass, adenopathy or free pelvic fluid collections. The prostate gland and seminal vesicles are unremarkable. No inguinal mass or adenopathy.  IMPRESSION: 1. No acute findings involving the chest. 2. L4 fracture involving the anterior superior aspect. There is also a nondisplaced spinous process fracture. The facets are intact. No laminar fractures 3. 11 mm right UPJ calculus causing marked hydronephrosis of the right kidney. 4. Bilateral renal calculi.   Electronically Signed   By: Loralie Champagne M.D.   On: 05/20/2013 17:27   Ct Cervical Spine Wo Contrast  05/20/2013   CLINICAL DATA:  Fall from ladder. Trauma to back of head. Positive loss of consciousness. Scalp hematoma.  EXAM: CT HEAD WITHOUT CONTRAST  CT CERVICAL SPINE WITHOUT CONTRAST  TECHNIQUE: Multidetector CT imaging of the head and cervical spine was performed following the standard protocol without intravenous contrast. Multiplanar CT image reconstructions of the cervical spine were also generated.  COMPARISON:  None.  FINDINGS: CT HEAD FINDINGS  No acute cortical infarct, hemorrhage, or mass lesion is present. A left parietal scalp hematoma is present. There is no underlying fracture. Mild mucosal thickening is  present in the maxillary sinuses bilaterally. The paranasal sinuses and mastoid air cells are otherwise clear.  CT CERVICAL SPINE FINDINGS  The cervical spine is imaged from the skull base through T1. Vertebral body heights and alignment are maintained. No  acute fracture or traumatic subluxation is evident. Asymmetric right-sided facet hypertrophy at C2-3 results in mild right foraminal stenosis. Bilateral foraminal narrowing is present at C3-4 with uncovertebral spurring left greater than right. Asymmetric left-sided facet hypertrophy is present at C4-5 due to uncovertebral and facet disease. Uncovertebral and facet disease leads to moderate foraminal narrowing bilaterally at C5-6 P the right greater than left foraminal narrowing is present at C6-7.  The soft tissues of the neck demonstrate mild heterogeneity within the thyroid without a dominant lesion. No significant adenopathy is present. The lung apices are clear.  IMPRESSION: 1. Multilevel spondylosis of the cervical spine. 2. No acute fracture or traumatic subluxation.   Electronically Signed   By: Gennette Pac M.D.   On: 05/20/2013 17:12   Ct Abdomen Pelvis W Contrast  05/20/2013   CLINICAL DATA:  Larey Seat off a ladder. Severe back pain. Marland Kitchen  EXAM: CT CHEST, ABDOMEN, AND PELVIS WITH CONTRAST  TECHNIQUE: Multidetector CT imaging of the chest, abdomen and pelvis was performed following the standard protocol during bolus administration of intravenous contrast.  CONTRAST:  OMNIPAQUE IOHEXOL 350 MG/ML SOLN  COMPARISON:  None.  FINDINGS: CT CHEST FINDINGS  The chest wall is unremarkable. No definite rib, sternal or thoracic vertebral body fractures. No chest wall contusion is identified.  The heart is normal in size. No pericardial effusion. The aorta is normal in caliber. No dissection. The branch vessels are patent. No mediastinal or hilar mass, adenopathy or hematoma. The esophagus is grossly normal.  Examination of the lung parenchyma demonstrates no acute pulmonary findings. No pulmonary contusion, pneumothorax or pleural effusion. There is mild dependent bibasilar atelectasis. No worrisome pulmonary lesions. The tracheobronchial tree is unremarkable.  CT ABDOMEN AND PELVIS FINDINGS  The solid abdominal  organs are intact. No acute injury of the liver or spleen is identified. The pancreas is normal. There is marked hydronephrosis of the right kidney due to an obstructing 11 mm UPJ calculus. Bilateral renal calculi are noted. The gallbladder is normal. No common bile duct dilatation.  The stomach, duodenum, small bowel and colon are grossly normal without oral contrast. No free air or free abdominal/pelvic fluid. The appendix is normal.  There is a fracture of the L4 vertebral body. This involves the anterior superior aspect of the vertebral body. There is also a fracture involving the spinous process of L4. Associated moderate paraspinal hematoma. No other lumbar fractures are identified.  The bony pelvis is intact. Moderate degenerative changes involving both hips but no hip or pelvic fractures. The bladder is normal. No pelvic mass, adenopathy or free pelvic fluid collections. The prostate gland and seminal vesicles are unremarkable. No inguinal mass or adenopathy.  IMPRESSION: 1. No acute findings involving the chest. 2. L4 fracture involving the anterior superior aspect. There is also a nondisplaced spinous process fracture. The facets are intact. No laminar fractures 3. 11 mm right UPJ calculus causing marked hydronephrosis of the right kidney. 4. Bilateral renal calculi.   Electronically Signed   By: Loralie Champagne M.D.   On: 05/20/2013 17:27       EKG Interpretation     Ventricular Rate:  54 PR Interval:  158 QRS Duration: 78 QT Interval:  400 QTC Calculation: 379 R Axis:  33 Text Interpretation:  Sinus bradycardia Anterior infarct , age undetermined Abnormal ECG No significant change since last tracing            MDM   1. Fall, initial encounter   2. Head injury, initial encounter   3. Lumbar vertebral fracture, closed, initial encounter   4. Shoulder strain, right, initial encounter    Patient status post fall just from 3-4 feet. Extensive workup to include pain CT head  neck chest abdomen and pelvis and plain x-rays of the right shoulder. No bony injuries of the right shoulder no CT abnormalities with the exception of L4 superior aspect body fracture and a spinous process fracture. These are stable no evidence of blood in the spinal canal. Patient has an orthopedic doctor to followup with. Patient given precautions about the concussion due to head injury. Patient provided a work note for 7 days. Patient has his own orthopedic doctor to followup with. Right shoulder shows no evidence of a bony injury however internal injury is possible we'll treat with a shoulder immobilizer.  I personally performed the services described in this documentation, which was scribed in my presence. The recorded information has been reviewed and is accurate.      Shelda Jakes, MD 05/20/13 [phone]

## 2013-05-20 NOTE — ED Notes (Addendum)
Pt arrived via pov.  Applied c-collar in car, assisted patient to stretcher and placed pt in room 14.  Notified Dr. Elesa Massed of patients arrival regarding pts condition.

## 2013-05-20 NOTE — ED Notes (Signed)
MD at bedside. 

## 2013-05-21 ENCOUNTER — Emergency Department (HOSPITAL_COMMUNITY)
Admission: EM | Admit: 2013-05-21 | Discharge: 2013-05-21 | Discharge status: Against Medical Advice | Payer: BC Managed Care – PPO

## 2013-08-23 ENCOUNTER — Other Ambulatory Visit: Payer: Self-pay | Admitting: *Deleted

## 2013-08-24 MED ORDER — CLONAZEPAM 1 MG PO TABS: ORAL_TABLET | ORAL | Status: DC

## 2013-08-24 NOTE — Telephone Encounter (Signed)
Refill request for clonazepam Last filled by MD on - 06/07/13 #30 x0 Last Appt: 08/01/12 Next Appt: none Please advise refill? Pt should have 0 pills left.

## 2013-12-24 ENCOUNTER — Telehealth: Payer: Self-pay | Admitting: Gastroenterology

## 2013-12-24 NOTE — Telephone Encounter (Signed)
Pt has noticed bright red blood in his stool. Pt would like to be seen. Pt scheduled to see Tye Savoy NP 12/21/13@3pm . Pt aware.

## 2013-12-28 ENCOUNTER — Encounter: Payer: Self-pay | Admitting: Nurse Practitioner

## 2013-12-28 ENCOUNTER — Ambulatory Visit (INDEPENDENT_AMBULATORY_CARE_PROVIDER_SITE_OTHER): Payer: BC Managed Care – PPO | Admitting: Nurse Practitioner

## 2013-12-28 VITALS — BP 148/70 | HR 68 | Ht 71.0 in | Wt 251.0 lb

## 2013-12-28 DIAGNOSIS — K648 Other hemorrhoids: Secondary | ICD-10-CM

## 2013-12-28 DIAGNOSIS — K625 Hemorrhage of anus and rectum: Secondary | ICD-10-CM

## 2013-12-28 MED ORDER — HYDROCORTISONE ACETATE 25 MG RE SUPP: RECTAL | Status: DC

## 2013-12-28 MED ORDER — NA SULFATE-K SULFATE-MG SULF 17.5-3.13-1.6 GM/177ML PO SOLN: 1.0000 | Freq: Once | ORAL | Status: DC

## 2013-12-28 NOTE — Progress Notes (Signed)
    HPI :  Patient is a 63 year old male here for evaluation of rectal bleeding. Patient had a screening colonoscopy by Dr. Amedeo Plenty March 2007. Colonoscopy was normal. He is due for another colonoscopy in 2017. Patient began seeing blood when he wipes about 6 months ago. He's had some burning and itching around the anal area. Recently, the amount of blood has increased. Patient does endorse hard stools. He has not started any new medications recently no abdominal pain or other GI complaints.  Past Medical History  Diagnosis Date  . Hereditary essential tremor   . History of nephrolithiasis   . DDD (degenerative disc disease), lumbar   . Hypogonadism, male 08/10/2011  . Restless legs syndrome 08/09/2011  . Fatty liver disease, nonalcoholic 10/11/270  . Laryngeal nodule 07/2011  . Kidney stones     multiple times  . Diabetes mellitus without complication     Family History  Problem Relation Age of Onset  . Cancer Mother     breast  . Heart disease Father     CAD/MI in his 32s  . Hypertension Father   . Diabetes Sister    History  Substance Use Topics  . Smoking status: Never Smoker   . Smokeless tobacco: Former Systems developer    Quit date: 06/12/2011  . Alcohol Use: No   Current Outpatient Prescriptions  Medication Sig Dispense Refill  . clonazePAM (KLONOPIN) 1 MG tablet 1/2-1 tab po at bedtime for restless legs syndrome  30 tablet  0  . ibuprofen (ADVIL,MOTRIN) 200 MG tablet Take 600 mg by mouth every 6 (six) hours as needed. pain      . Multiple Vitamin (MULITIVITAMIN WITH MINERALS) TABS Take 1 tablet by mouth daily.       No current facility-administered medications for this visit.   No Known Allergies  Review of Systems: All systems reviewed and negative except where noted in HPI.   Physical Exam: BP 148/70  Pulse 68  Ht 5\' 11"  (1.803 m)  Wt 251 lb (113.853 kg)  BMI 35.02 kg/m2 Constitutional: Pleasant,well-developed, white male in no acute distress. HEENT: Normocephalic and  atraumatic. Conjunctivae are normal. No scleral icterus. Neck supple.  Cardiovascular: Normal rate, regular rhythm.  Pulmonary/chest: Effort normal and breath sounds normal. No wheezing, rales or rhonchi. Abdominal: Soft, nondistended, nontender. Bowel sounds active throughout. There are no masses palpable. No hepatomegaly. Rectal: Internal hemorrhoids on anoscopy. No external lesions Extremities: no edema Lymphadenopathy: No cervical adenopathy noted. Neurological: Alert and oriented to person place and time. Skin: Skin is warm and dry. No rashes noted. Psychiatric: Normal mood and affect. Behavior is normal.   ASSESSMENT AND PLAN:  63 year old male with rectal bleeding with bowel movements. He has internal hemorrhoids on anoscopy which is likely cause of bleeding.  It has been 8 years since last colonoscopy so it is not unreasonable to proceed early with repeat examination of the colon to rule out any other etiologies of bleeding.  The risks, benefits, and alternatives to colonoscopy with possible biopsy and possible polypectomy were discussed with the patient and he consents to proceed.  In meantime will try Anusol suppositories and Miralax for constipation

## 2013-12-28 NOTE — Patient Instructions (Addendum)
You have been scheduled for a colonoscopy. Please follow written instructions given to you at your visit today.  We have given you the sample of the colonoscopy prep. If you use inhalers (even only as needed), please bring them with you on the day of your procedure. Your physician has requested that you go to www.startemmi.com and enter the access code given to you at your visit today. This web site gives a general overview about your procedure. However, you should still follow specific instructions given to you by our office regarding your preparation for the procedure.  Suprep sample given Miralax daily Prescription sent  Anusol Supp  Avoid straining.

## 2013-12-31 NOTE — Progress Notes (Signed)
Reviewed and agree with management.  To consider band ligation of internal hemorrhoids if it is determined that this is the source for rectal bleeding and symptoms continue. Sandy Salaam. Deatra Ina, M.D., [facility]

## 2014-01-02 ENCOUNTER — Encounter: Payer: Self-pay | Admitting: Gastroenterology

## 2014-01-02 ENCOUNTER — Ambulatory Visit (AMBULATORY_SURGERY_CENTER): Payer: BC Managed Care – PPO | Admitting: Gastroenterology

## 2014-01-02 VITALS — BP 141/81 | HR 64 | Temp 97.5°F | Resp 11 | Ht 71.0 in | Wt 251.0 lb

## 2014-01-02 DIAGNOSIS — K648 Other hemorrhoids: Secondary | ICD-10-CM

## 2014-01-02 DIAGNOSIS — K625 Hemorrhage of anus and rectum: Secondary | ICD-10-CM

## 2014-01-02 MED ORDER — SODIUM CHLORIDE 0.9 % IV SOLN: 500.0000 mL | INTRAVENOUS | Status: DC

## 2014-01-02 NOTE — Patient Instructions (Addendum)
YOU HAD AN ENDOSCOPIC PROCEDURE TODAY AT THE [facility]: Refer to the procedure report that was given to you for any specific questions about what was found during the examination.  If the procedure report does not answer your questions, please call your gastroenterologist to clarify.  If you requested that your care partner not be given the details of your procedure findings, then the procedure report has been included in a sealed envelope for you to review at your convenience later.  YOU SHOULD EXPECT: Some feelings of bloating in the abdomen. Passage of more gas than usual.  Walking can help get rid of the air that was put into your GI tract during the procedure and reduce the bloating. If you had a lower endoscopy (such as a colonoscopy or flexible sigmoidoscopy) you may notice spotting of blood in your stool or on the toilet paper. If you underwent a bowel prep for your procedure, then you may not have a normal bowel movement for a few days.  DIET: Your first meal following the procedure should be a light meal and then it is ok to progress to your normal diet.  A half-sandwich or bowl of soup is an example of a good first meal.  Heavy or fried foods are harder to digest and may make you feel nauseous or bloated.  Likewise meals heavy in dairy and vegetables can cause extra gas to form and this can also increase the bloating.  Drink plenty of fluids but you should avoid alcoholic beverages for 24 hours.  ACTIVITY: Your care partner should take you home directly after the procedure.  You should plan to take it easy, moving slowly for the rest of the day.  You can resume normal activity the day after the procedure however you should NOT DRIVE or use heavy machinery for 24 hours (because of the sedation medicines used during the test).    SYMPTOMS TO REPORT IMMEDIATELY: A gastroenterologist can be reached at any hour.  During normal business hours, 8:30 AM to 5:00 PM Monday through Friday,  call [phone].  After hours and on weekends, please call the GI answering service at [phone] who will take a message and have the physician on call contact you.   Following lower endoscopy (colonoscopy or flexible sigmoidoscopy):  Excessive amounts of blood in the stool  Significant tenderness or worsening of abdominal pains  Swelling of the abdomen that is new, acute  Fever of 100F or higher    FOLLOW UP: If any biopsies were taken you will be contacted by phone or by letter within the next 1-3 weeks.  Call your gastroenterologist if you have not heard about the biopsies in 3 weeks.  Our staff will call the home number listed on your records the next business day following your procedure to check on you and address any questions or concerns that you may have at that time regarding the information given to you following your procedure. This is a courtesy call and so if there is no answer at the home number and we have not heard from you through the emergency physician on call, we will assume that you have returned to your regular daily activities without incident.  SIGNATURES/CONFIDENTIALITY: You and/or your care partner have signed paperwork which will be entered into your electronic medical record.  These signatures attest to the fact that that the information above on your After Visit Summary has been reviewed and is understood.  Full responsibility of the confidentiality   of this discharge information lies with you and/or your care-partner.    INFORMATION ON HEMORRHOIDS GIVEN TO YOU TODAY  INFORMATION ON BANDING OF HEMORRHOIDS GIVEN TO YOU TODAY  PICK UP ANUSOL SUPPOSITORIES ORDERED FOR YOU -[facility]

## 2014-01-02 NOTE — Op Note (Signed)
[facility]  [address] [place], [address]   COLONOSCOPY PROCEDURE REPORT  PATIENT: [name], [name]  MR#: [id number] BIRTHDATE: 05-Aug-1950 , 32  yrs. old GENDER: Male ENDOSCOPIST: Inda Castle, MD REFERRED DZ:[id number] Anitra Lauth, M.D. PROCEDURE DATE:  01/02/2014 PROCEDURE:   Colonoscopy, diagnostic First Screening Colonoscopy - Avg.  risk and is 50 yrs.  old or older - No.  Prior Negative Screening - Now for repeat screening. Other: See Comments  History of Adenoma - Now for follow-up colonoscopy & has been > or = to 3 yrs.  N/A  Polyps Removed Today? No.  Recommend repeat exam, <10 yrs? No. ASA CLASS:   Class II INDICATIONS:Rectal Bleeding. MEDICATIONS: MAC sedation, administered by CRNA and Propofol (Diprivan) 210 mg IV  DESCRIPTION OF PROCEDURE:   After the risks benefits and alternatives of the procedure were thoroughly explained, informed consent was obtained.  A digital rectal exam revealed no abnormalities of the rectum.   The LB [id number]  endoscope was introduced through the anus and advanced to the terminal ileum which was intubated for a short distance. No adverse events experienced.   The quality of the prep was Suprep good  The instrument was then slowly withdrawn as the colon was fully examined.      COLON FINDINGS: A normal appearing cecum, ileocecal valve, and appendiceal orifice were identified.  The ascending, hepatic flexure, transverse, splenic flexure, descending, sigmoid colon and rectum appeared unremarkable.  No polyps or cancers were seen. Retroflexed views revealed no abnormalities. The time to cecum=4 minutes 32 seconds.  Withdrawal time=6 minutes 52 seconds.  The scope was withdrawn and the procedure completed. COMPLICATIONS: There were no complications.  ENDOSCOPIC IMPRESSION: Normal colon  Limited rectal bleeding likely secondary to internal hemorrhoids (anoscopy demonstrated internal  hemorrhoids)  RECOMMENDATIONS: Anusol HC suppositories; if bleeding continues to consider band ligation of internal hemorrhoids Colonoscopy 10 years  eSigned:  Inda Castle, MD 01/02/2014 4:01 PM   cc:   PATIENT NAME:  Micheal Guerra, Micheal Guerra MR#: [id number]

## 2014-01-02 NOTE — Progress Notes (Signed)
Report to PACU, RN, vss, BBS= Clear.  

## 2014-01-03 ENCOUNTER — Telehealth: Payer: Self-pay | Admitting: *Deleted

## 2014-01-03 NOTE — Telephone Encounter (Signed)
  Follow up Call-  Call back number 01/02/2014  Post procedure Call Back phone  # [phone]  Permission to leave phone message Yes     Patient questions:  Do you have a fever, pain , or abdominal swelling? no Pain Score  0 *  Have you tolerated food without any problems? yes  Have you been able to return to your normal activities? yes  Do you have any questions about your discharge instructions: Diet   no Medications  no Follow up visit  no  Do you have questions or concerns about your Care? no  Actions: * If pain score is 4 or above: No action needed, pain <4.

## 2014-05-21 ENCOUNTER — Encounter: Payer: Self-pay | Admitting: Neurology

## 2014-05-21 ENCOUNTER — Ambulatory Visit (INDEPENDENT_AMBULATORY_CARE_PROVIDER_SITE_OTHER): Payer: BC Managed Care – PPO | Admitting: Neurology

## 2014-05-21 VITALS — BP 134/79 | HR 62 | Ht 71.0 in | Wt 238.0 lb

## 2014-05-21 DIAGNOSIS — R251 Tremor, unspecified: Secondary | ICD-10-CM

## 2014-05-21 MED ORDER — PROPRANOLOL HCL ER 60 MG PO CP24: 60.0000 mg | ORAL_CAPSULE | Freq: Every day | ORAL | Status: DC

## 2014-05-21 NOTE — Progress Notes (Signed)
PATIENT: Micheal Guerra DOB: 1951/04/23  HISTORICAL  OBRIAN BULSON is a 58 yr RH male, accompanied by his wife, daughter, referred by his primary care physician Dr. Leighton Ruff for evaluation of worsening right hand tremor, right side neck pain,  He had a past medical history of cervical radiculopathy, questionable cervical myelopathy, was under the Care of [facility] neurosurgeon, had yearly checkup, most recent MRI was November 2014 at [facility], multilevel degenerative disc disease, most severe at C 5 6, C6-7, with moderate right foraminal stenosis mild to moderate canal stenosis. He also had a past medical history of lumbar decompression surgery, glucose intolerance, not on any medication treatment,  He worked at a maintenance job, which require welding, bending down, He noticed bilateral hands tremor  Since 2014, getting worse over the past few months, especially his right hand, is difficult for him to sign, holding his machine, getting difficult to 4 form his job,  He felt of bilateral, with lumbar fracture in November 2014,no surgeries needed, but ever since the accident, he complains of worsening right-sided neck pain, radiating pain to his right shoulder, right arm, subjective weakness of right hand, and right arm,  He has mild gait difficulty because of low back pain, also has intermittent bilateral anterior thigh area cold achy sensation, tends to get up pacing around if he sits for too long, will trying to go to sleep, was treated with clonazepam 1 mg every night for a while, which was helpful, but complains of sleepiness,  He has frequent nocturnal urination, urinate 7-8 times each night,  His father had tremor in his old age, to the point of difficulty holding the cup of drink there was no head titubation.  REVIEW OF SYSTEMS: Full 14 system review of systems performed and notable only for hearing loss, snoring, joints pain, numbness, tremor, snoring, restless  legs  ALLERGIES: No Known Allergies  HOME MEDICATIONS: Current Outpatient Prescriptions on File Prior to Visit  Medication Sig Dispense Refill  . ibuprofen (ADVIL,MOTRIN) 200 MG tablet Take 600 mg by mouth every 6 (six) hours as needed. pain    . Multiple Vitamin (MULITIVITAMIN WITH MINERALS) TABS Take 1 tablet by mouth daily.     No current facility-administered medications on file prior to visit.    PAST MEDICAL HISTORY: Past Medical History  Diagnosis Date  . Hereditary essential tremor   . History of nephrolithiasis   . DDD (degenerative disc disease), lumbar   . Hypogonadism, male 08/10/2011  . Restless legs syndrome 08/09/2011  . Fatty liver disease, nonalcoholic 6/81/2751  . Laryngeal nodule 07/2011  . Kidney stones     multiple times  . Diabetes mellitus without complication     PAST SURGICAL HISTORY: Past Surgical History  Procedure Laterality Date  . Lumbar spine surgery  04/2002    ruptured disk  . Inguinal hernia repair  2004  and  2005    bilateral  . Colonoscopy  2007    normal per pt report  . Cystoscopy w/ ureteroscopy w/ lithotripsy  02/2003    Left side (Dr. Jeffie Pollock)  . Ureteral stent placement  2011    FAMILY HISTORY: Family History  Problem Relation Age of Onset  . Cancer Mother     breast  . Heart disease Father     CAD/MI in his 55s  . Hypertension Father   . Diabetes Sister     SOCIAL HISTORY:  History   Social History  . Marital Status: Married  Spouse Name: N/A    Number of Children: N/A  . Years of Education: N/A   Occupational History  . Not on file.   Social History Main Topics  . Smoking status: Never Smoker   . Smokeless tobacco: Former Systems developer    Quit date: 06/12/2011  . Alcohol Use: No  . Drug Use: No  . Sexual Activity: Not on file   Other Topics Concern  . Not on file   Social History Narrative   Married, 1 daughter, 3 grandchildren.   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   12th grade education, [employer].   No cigarettes, but chewed tobacco many years and quit 06/2011.   No alcohol or drug use.   No exercise.     PHYSICAL EXAM   Filed Vitals:   05/21/14 1151  BP: 134/79  Pulse: 62  Height: 5\' 11"  (1.803 m)  Weight: 238 lb (107.956 kg)    Not recorded      Body mass index is 33.21 kg/(m^2).   Generalized: In no acute distress  Neck: Supple, no carotid bruits   Cardiac: Regular rate rhythm  Pulmonary: Clear to auscultation bilaterally  Musculoskeletal: No deformity  Neurological examination  Mentation: Alert oriented to time, place, history taking, and causual conversation  Cranial nerve II-XII: Pupils were equal round reactive to light. Extraocular movements were full.  Visual field were full on confrontational test. Bilateral fundi were sharp.  Facial sensation and strength were normal. Hearing was intact to finger rubbing bilaterally. Uvula tongue midline.  Head turning and shoulder shrug and were normal and symmetric.Tongue protrusion into cheek strength was normal.  Motor: he has mild right on shoulder abduction, external rotation, elbow flexion, wrist extension weakness  Sensory: Intact to fine touch, pinprick, preserved vibratory sensation, and proprioception at toes.  Coordination: Normal finger to nose, heel-to-shin bilaterally there was no truncal ataxia  Gait: Rising up from seated position without assistance, leaning forward, tends to splint his lumbar paraspinal muscles  Romberg signs: Negative  Deep tendon reflexes: Brachioradialis 2/2, biceps 2/2, triceps 2/2, patellar 2/2, Achilles 2/2, plantar responses were flexor bilaterally.   DIAGNOSTIC DATA (LABS, IMAGING, TESTING) - I reviewed patient records, labs, notes, testing and imaging myself where available.  Lab Results  Component Value Date   WBC 8.2 05/20/2013   HGB 14.5 05/20/2013   HCT 42.1  05/20/2013   MCV 87.5 05/20/2013   PLT 172 05/20/2013      Component Value Date/Time   NA 142 05/20/2013 1510   K 3.8 05/20/2013 1510   CL 103 05/20/2013 1510   CO2 28 05/20/2013 1510   GLUCOSE 172* 05/20/2013 1510   BUN 16 05/20/2013 1510   CREATININE 1.20 05/20/2013 1510   CREATININE 0.91 08/09/2011 1609   CALCIUM 9.5 05/20/2013 1510   PROT 7.0 08/01/2012 1027   ALBUMIN 3.9 08/01/2012 1027   AST 38* 08/01/2012 1027   ALT 64* 08/01/2012 1027   ALKPHOS 97 08/01/2012 1027   BILITOT 1.3* 08/01/2012 1027   GFRNONAA 63* 05/20/2013 1510   GFRAA 73* 05/20/2013 1510   Lab Results  Component Value Date   CHOL 130 08/01/2012   HDL 32.80* 08/01/2012   LDLCALC 79 08/09/2011   LDLDIRECT 59.0 08/01/2012   TRIG 217.0* 08/01/2012   CHOLHDL 4 08/01/2012   Lab Results  Component Value Date   HGBA1C 13.8* 08/01/2012   Lab Results  Component Value  Date   TXHFSFSE39 532 08/09/2011    ASSESSMENT AND PLAN  DAYSON ABOUD is a 63 y.o. male complains of worsening bilateral hand tremor, right worse than left, there was also a history of right cervical radiculopathy, mild right arm, proximal and distal weakness,  1, history of memory is most consistent with essential tremor, which has made worse with his right cervical radiculopathy, 2. Will try Inderal ER 60 mg every night 3.  EMG nerve conduction study to further evaluate right cervical radiculopathy, also advised patient to bring MRI cervical spine CD to review   Marcial Pacas, M.D. Ph.D.  [facility] [address] [place], [place] [address] [phone]

## 2014-05-22 LAB — TSH: TSH: 1.33 u[IU]/mL (ref 0.450–4.500)

## 2014-06-04 ENCOUNTER — Ambulatory Visit (INDEPENDENT_AMBULATORY_CARE_PROVIDER_SITE_OTHER): Payer: BC Managed Care – PPO | Admitting: Neurology

## 2014-06-04 DIAGNOSIS — Z0289 Encounter for other administrative examinations: Secondary | ICD-10-CM

## 2014-06-04 DIAGNOSIS — M5030 Other cervical disc degeneration, unspecified cervical region: Secondary | ICD-10-CM

## 2014-06-04 DIAGNOSIS — R251 Tremor, unspecified: Secondary | ICD-10-CM

## 2014-06-04 DIAGNOSIS — M5416 Radiculopathy, lumbar region: Secondary | ICD-10-CM

## 2014-06-04 MED ORDER — PRIMIDONE 50 MG PO TABS: 50.0000 mg | ORAL_TABLET | Freq: Three times a day (TID) | ORAL | Status: DC

## 2014-06-04 NOTE — Progress Notes (Signed)
PATIENT: JERRALD DOVERSPIKE DOB: 11/30/1950  HISTORICAL  KEOLA HENINGER is a 63 yr RH male, accompanied by his wife, daughter, referred by his primary care physician Dr. Leighton Ruff for evaluation of worsening right hand tremor, right side neck pain,  He had a past medical history of cervical radiculopathy, questionable cervical myelopathy, was under the Care of [facility] neurosurgeon, had yearly checkup, most recent MRI was November 2014 at [facility], multilevel degenerative disc disease, most severe at C 5 6, C6-7, with moderate right foraminal stenosis mild to moderate canal stenosis. He also had a past medical history of lumbar decompression surgery, glucose intolerance, not on any medication treatment,  He worked at a maintenance job, which require welding, bending down, He noticed bilateral hands tremor  Since 2014, getting worse over the past few months, especially his right hand, is difficult for him to sign, holding his machine, getting difficult to 4 form his job,  He felt of ladder, with lumbar fracture in November 2014,no surgeries needed, but ever since the accident, he complains of worsening right-sided neck pain, radiating pain to his right shoulder, right arm, subjective weakness of right hand, and right arm,  He has mild gait difficulty because of low back pain, also has intermittent bilateral anterior thigh area cold achy sensation, tends to get up pacing around if he sits for too long, will trying to go to sleep, was treated with clonazepam 1 mg every night for a while, which was helpful, but complains of sleepiness,  He has frequent nocturnal urination, urinate 7-8 times each night,  His father had tremor in his old age, to the point of difficulty holding the cup of drink there was no head titubation.  UPDATE Nov 24th 2015:  We have tried Inderal xr 60 mg every day for his bilateral hands tremor, there is no significant benefit, he complains of feeling  fatigue, sluggish,  He continue have significant neck pain, radiating pain to left arm, shoulder, but increase to right hand weakness, and right hand tremor, to the point of difficulty functioning, holding utensils, writing,   We have reviewed MRI cervical spine, and lumbar spine from November 2014, [facility],  MRI cervical:  C3-C4: Central disc protrusion. This contacts and flattens the spinal cord. Moderate to advanced spinal canal narrowing. Mild bilateral foraminal narrowing. C4-C5: Broad-based posterior disc osteophyte complex. This contacts the spinal cord. Mild spinal canal narrowing. Moderate left foraminal narrowing. C5-C6: Broad-based posterior disc osteophyte complex. This contacts and mildly flattens the cord. Moderate spinal canal narrowing. Moderate bilateral foraminal narrowing. C6-C7: Broad-based posterior disc osteophyte complex. This contacts the anterior cord. Mild spinal canal narrowing. Moderate right and mild left foraminal narrowing.  MRI lumbar: L1-L2: Moderate facet degenerative changes. No stenosis. L2-L3: Severe facet degenerative changes and ligamentum flavum hypertrophy. Moderate spinal canal narrowing. No foraminal narrowing. L3-L4: Disc desiccation. A sliver hyperintense T1 and T2 signal extends from the fracture involving the L4 superior endplate into this disc space. This is favored represent resolving hemorrhage. Broad-based disc bulge. Severe facet degenerative changes. Severe spinal canal narrowing. Mild left foraminal narrowing. L4-L5: Suspected postsurgical changes related to previous laminectomy and discectomy. Little disc material remains. Posterior endplate osteophyte formation. Moderate facet degenerative changes. Mild bony narrowing of the right foramen. L5-S1: Moderate facet degenerative changes. No stenosis.   today's electrodiagnostic study showed no evidence of right cervical radiculopathy, he is under closed monitoring of [facility]  neurosurgeon, will have a follow-up appointment with them in few months,  the most bothersome symptoms for him is increased right hand tremor, difficulty using his right hand   REVIEW OF SYSTEMS: Full 14 system review of systems performed and notable only for as above  ALLERGIES: No Known Allergies  HOME MEDICATIONS: Current Outpatient Prescriptions on File Prior to Visit  Medication Sig Dispense Refill  . ibuprofen (ADVIL,MOTRIN) 200 MG tablet Take 600 mg by mouth every 6 (six) hours as needed. pain    . Multiple Vitamin (MULITIVITAMIN WITH MINERALS) TABS Take 1 tablet by mouth daily.    . propranolol ER (INDERAL LA) 60 MG 24 hr capsule Take 1 capsule (60 mg total) by mouth daily. 30 capsule 11   No current facility-administered medications on file prior to visit.    PAST MEDICAL HISTORY: Past Medical History  Diagnosis Date  . Hereditary essential tremor   . History of nephrolithiasis   . DDD (degenerative disc disease), lumbar   . Hypogonadism, male 08/10/2011  . Restless legs syndrome 08/09/2011  . Fatty liver disease, nonalcoholic 1/94/1740  . Laryngeal nodule 07/2011  . Kidney stones     multiple times  . Diabetes mellitus without complication     PAST SURGICAL HISTORY: Past Surgical History  Procedure Laterality Date  . Lumbar spine surgery  04/2002    ruptured disk  . Inguinal hernia repair  2004  and  2005    bilateral  . Colonoscopy  2007    normal per pt report  . Cystoscopy w/ ureteroscopy w/ lithotripsy  02/2003    Left side (Dr. Jeffie Pollock)  . Ureteral stent placement  2011    FAMILY HISTORY: Family History  Problem Relation Age of Onset  . Cancer Mother     breast  . Heart disease Father     CAD/MI in his 20s  . Hypertension Father   . Diabetes Sister     SOCIAL HISTORY:  History   Social History  . Marital Status: Married    Spouse Name: N/A    Number of Children: N/A  . Years of Education: N/A   Occupational History  . Not on file.    Social History Main Topics  . Smoking status: Never Smoker   . Smokeless tobacco: Former Systems developer    Quit date: 06/12/2011  . Alcohol Use: No  . Drug Use: No  . Sexual Activity: Not on file   Other Topics Concern  . Not on file   Social History Narrative   Married, 1 daughter, 3 grandchildren.   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   12th grade education, [employer].   No cigarettes, but chewed tobacco many years and quit 06/2011.   No alcohol or drug use.   No exercise.     PHYSICAL EXAM   There were no vitals filed for this visit.  Not recorded      There is no weight on file to calculate BMI.   Generalized: In no acute distress  Neck: Supple, no carotid bruits   Cardiac: Regular rate rhythm  Pulmonary: Clear to auscultation bilaterally  Musculoskeletal: No deformity  Neurological examination  Mentation: Alert oriented to time, place, history taking, and causual conversation  Cranial nerve II-XII: Pupils were equal round reactive to light. Extraocular movements were full.  Visual field were full on confrontational test. Bilateral fundi were sharp.  Facial sensation and strength were normal. Hearing was intact to finger rubbing bilaterally. Uvula tongue midline.  Head turning and shoulder shrug and were normal and symmetric.Tongue protrusion into cheek strength was normal.  Motor: he has mild right on shoulder abduction, external rotation, elbow flexion, wrist extension weakness, which is also limited by his right shoulder pain, he has significant right worse than left bilateral hands postural tremor, no bradykinesia, no rigidity  Sensory: Intact to fine touch, pinprick, preserved vibratory sensation, and proprioception at toes.  Coordination: Normal finger to nose, heel-to-shin bilaterally there was no truncal ataxia  Gait: Rising up from seated position without  assistance, leaning forward, tends to splint his lumbar paraspinal muscles  Romberg signs: Negative  Deep tendon reflexes: Brachioradialis 2/2, biceps 2/2, triceps 2/2, patellar 2/2, Achilles 2/2, plantar responses were flexor bilaterally.   DIAGNOSTIC DATA (LABS, IMAGING, TESTING) - I reviewed patient records, labs, notes, testing and imaging myself where available.  Lab Results  Component Value Date   WBC 8.2 05/20/2013   HGB 14.5 05/20/2013   HCT 42.1 05/20/2013   MCV 87.5 05/20/2013   PLT 172 05/20/2013      Component Value Date/Time   NA 142 05/20/2013 1510   K 3.8 05/20/2013 1510   CL 103 05/20/2013 1510   CO2 28 05/20/2013 1510   GLUCOSE 172* 05/20/2013 1510   BUN 16 05/20/2013 1510   CREATININE 1.20 05/20/2013 1510   CREATININE 0.91 08/09/2011 1609   CALCIUM 9.5 05/20/2013 1510   PROT 7.0 08/01/2012 1027   ALBUMIN 3.9 08/01/2012 1027   AST 38* 08/01/2012 1027   ALT 64* 08/01/2012 1027   ALKPHOS 97 08/01/2012 1027   BILITOT 1.3* 08/01/2012 1027   GFRNONAA 63* 05/20/2013 1510   GFRAA 73* 05/20/2013 1510   Lab Results  Component Value Date   CHOL 130 08/01/2012   HDL 32.80* 08/01/2012   LDLCALC 79 08/09/2011   LDLDIRECT 59.0 08/01/2012   TRIG 217.0* 08/01/2012   CHOLHDL 4 08/01/2012   Lab Results  Component Value Date   HGBA1C 13.8* 08/01/2012   Lab Results  Component Value Date   VITAMINB12 634 08/09/2011    ASSESSMENT AND PLAN  DJIBRIL GLOGOWSKI is a 63 y.o. male complains of worsening bilateral hand tremor, right worse than left, there was also known history of cervical, lumbar degenerative disc disease, no evidence of acute right cervical radiculopathy at today's electrodiagnostic study, he is under close supervision of [facility] neurosurgeon, MRI scan in November 2014 detailed above,  1, for his bilateral hands tremor, most consistent with essential tremor, he could not tolerate Inderal, planes of excessive fatigue, lack of benefit, 2. Will try  primidone 50 mg, titrating to 50 3 times a day 3. Return to clinic in 1 month   Marcial Pacas, M.D. Ph.D.  [facility] [address], [place], [place] [address] [phone]

## 2014-06-05 NOTE — Addendum Note (Signed)
Addended by: Marcial Pacas on: 06/05/2014 10:23 AM   Modules accepted: Level of Service

## 2014-06-05 NOTE — Procedures (Signed)
   NCS (NERVE CONDUCTION STUDY) WITH EMG (ELECTROMYOGRAPHY) REPORT   STUDY DATE: Nov 25th 2015 PATIENT NAME: Micheal Guerra DOB: 1951-05-06 MRN: [id number]    TECHNOLOGIST: Towana Badger ELECTROMYOGRAPHER: Marcial Pacas M.D.  CLINICAL INFORMATION:  63 years old gentleman, with past medical history of chronic low back pain, neck pain, bilateral hands tremor, presenting with worsening neck pain, radiating pain to bilateral shoulder, right hand weakness, increased bilateral hands tremor, right worse than left  FINDINGS: NERVE CONDUCTION STUDY: Bilateral ulnar sensory and motor responses were normal.  Left median sensory and motor responses were normal  Right median mixed, and sensory response showed mildly prolonged peak latency, with preserved snap amplitude. Right radial sensory response was normal.  Right median motor response showed mildly prolonged distal latency, with normal C map amplitude, F-wave latency, conduction velocity  NEEDLE ELECTROMYOGRAPHY:  Selected needle examination was performed at right upper extremity muscles, right cervical paraspinal muscles.  Needle examination of right extensor digital communis, biceps, triceps, deltoid, brachioradialis was normal  There was no spontaneous activity at right cervical paraspinal muscles, right C5, C6, 7  IMPRESSION:   This is an abnormal study. There is electrodiagnostic evidence of mild to moderate right carpal tunnel syndrome. There was no evidence of right cervical radiculopathy   INTERPRETING PHYSICIAN:   Marcial Pacas M.D. Ph.D. [facility] [address] [place], [place] [address] [phone]

## 2014-08-01 ENCOUNTER — Encounter: Payer: Self-pay | Admitting: Neurology

## 2014-08-01 ENCOUNTER — Telehealth: Payer: Self-pay | Admitting: Neurology

## 2014-08-01 ENCOUNTER — Ambulatory Visit (INDEPENDENT_AMBULATORY_CARE_PROVIDER_SITE_OTHER): Payer: BLUE CROSS/BLUE SHIELD | Admitting: Neurology

## 2014-08-01 VITALS — BP 122/74 | HR 76 | Ht 71.0 in | Wt 248.0 lb

## 2014-08-01 DIAGNOSIS — R0683 Snoring: Secondary | ICD-10-CM

## 2014-08-01 DIAGNOSIS — G4719 Other hypersomnia: Secondary | ICD-10-CM

## 2014-08-01 DIAGNOSIS — E669 Obesity, unspecified: Secondary | ICD-10-CM

## 2014-08-01 DIAGNOSIS — G2581 Restless legs syndrome: Secondary | ICD-10-CM

## 2014-08-01 DIAGNOSIS — G4730 Sleep apnea, unspecified: Secondary | ICD-10-CM

## 2014-08-01 DIAGNOSIS — R351 Nocturia: Secondary | ICD-10-CM

## 2014-08-01 NOTE — Telephone Encounter (Signed)
Dr. Marcial Pacas, refers patient for attended sleep study.  Height: 5'11"  Weight: 248 lb  BMI: 34.60  Past Medical History:   Hereditary essential tremor   . History of nephrolithiasis   . DDD (degenerative disc disease), lumbar   . Hypogonadism, male 08/10/2011  . Restless legs syndrome 08/09/2011  . Fatty liver disease, nonalcoholic 3/47/4259  . Laryngeal nodule 07/2011  . Kidney stones     multiple times  . Diabetes mellitus without complication      Sleep Symptoms: loud snoring, excessive day time sleepiness, restless legs, has frequent nocturnal urination, urinate 7-8 times each night   Epworth Score: ESS score (16)   Medication:   Glucose Blood (Strip) ONE TOUCH ULTRA TEST  2 (two) times daily. use for testing       Ibuprofen (Tab) ADVIL,MOTRIN 200 MG Take 600 mg by mouth every 6 (six) hours as needed. pain      Multiple Vitamin (Tab) multivitamin with minerals  Take 1 tablet by mouth daily.      Naproxen (Tab) NAPROSYN 500 MG as needed.      .         Ins: [employer]    Assessment & Plan:  Micheal Guerra is a 64 y.o. male complains of worsening bilateral hand tremor, right worse than left, there was also a history of right cervical radiculopathy, mild right arm, proximal and distal weakness,  1, history of memory is most consistent with essential tremor, which has made worse with his right cervical radiculopathy. 2. Sleep apnea, ESS 16, FSS 45, refer him to sleep study. 3. RTC in 6 months.  Orders Placed This Encounter  Procedures  . Ambulatory referral to Sleep Studies    Return in about 6 months (around 01/30/2015).    Please review patient information and submit instructions for scheduling and orders for sleep technologist. Thank you.

## 2014-08-01 NOTE — Telephone Encounter (Signed)
In House Sleep study request review: This patient has an underlying medical history of essential tremor, obesity, cervical radiculopathy, kidney stones, diabetes and restless leg syndrome and is referred by Dr. Krista Blue for an attended sleep study due to a report of snoring, excessive daytime somnolence, nocturia and restless leg symptoms. I will order a split-night sleep study and see the patient in sleep medicine consultation afterwards as appropriate. Please print this note and attach to sleep study chart/package.   Sleep Acquisition Technologist instructions: Please score at 3% and split if 2 hour estimated AHI >15/h, unless mandated otherwise by the insurance carrier.    Star Age, MD, PhD [facility])

## 2014-08-01 NOTE — Progress Notes (Signed)
PATIENT: Micheal Guerra DOB: 04-21-1951  HISTORICAL  [name] is a 64 yr RH male, accompanied by his wife, daughter, referred by his primary care physician Dr. Leighton Ruff for evaluation of worsening right hand tremor, right side neck pain,  He had a past medical history of cervical radiculopathy, questionable cervical myelopathy, was under the Care of [facility] neurosurgeon, had yearly checkup, most recent MRI was November 2014 at [facility], multilevel degenerative disc disease, most severe at C 5 6, C6-7, with moderate right foraminal stenosis mild to moderate canal stenosis. He also had a past medical history of lumbar decompression surgery, glucose intolerance, not on any medication treatment,  He worked at a maintenance job, which require welding, bending down, He noticed bilateral hands tremor  Since 2014, getting worse over the past few months, especially his right hand, is difficult for him to sign, holding his machine, getting difficult to 4 form his job,  He felt off ladder with lumbar fracture in November 2014,no surgeries needed, but ever since the accident, he complains of worsening right-sided neck pain, radiating pain to his right shoulder, right arm, subjective weakness of right hand, and right arm,  He has mild gait difficulty because of low back pain, also has intermittent bilateral anterior thigh area cold achy sensation, tends to get up pacing around if he sits for too long, will trying to go to sleep, was treated with clonazepam 1 mg every night for a while, which was helpful, but complains of sleepiness,  He has frequent nocturnal urination, urinate 7-8 times each night,  His father had tremor in his old age, to the point of difficulty holding the cup of drink there was no head titubation.  UPDATE Aug 01 2014: He has tried inderal , did not help his tremor much, he feel tired with medications, he has retired in Jan 2016.  He also has loud  snoring, excessive day time sleepiness, today's ESS score is 16, Fss score is 45  He continues to have bilateral hand shaking, neck pain, radiating pain to his left shoulder, low back pain, radiating pain to his right leg. Mild gait difficulity due to pain  REVIEW OF SYSTEMS: Full 14 system review of systems performed and notable only for hearing loss, snoring, joints pain, numbness, tremor, snoring, restless legs  ALLERGIES: No Known Allergies  HOME MEDICATIONS: Current Outpatient Prescriptions on File Prior to Visit  Medication Sig Dispense Refill  . ibuprofen (ADVIL,MOTRIN) 200 MG tablet Take 600 mg by mouth every 6 (six) hours as needed. pain    . Multiple Vitamin (MULITIVITAMIN WITH MINERALS) TABS Take 1 tablet by mouth daily.     No current facility-administered medications on file prior to visit.    PAST MEDICAL HISTORY: Past Medical History  Diagnosis Date  . Hereditary essential tremor   . History of nephrolithiasis   . DDD (degenerative disc disease), lumbar   . Hypogonadism, male 08/10/2011  . Restless legs syndrome 08/09/2011  . Fatty liver disease, nonalcoholic 4/70/9628  . Laryngeal nodule 07/2011  . Kidney stones     multiple times  . Diabetes mellitus without complication     PAST SURGICAL HISTORY: Past Surgical History  Procedure Laterality Date  . Lumbar spine surgery  04/2002    ruptured disk  . Inguinal hernia repair  2004  and  2005    bilateral  . Colonoscopy  2007    normal per pt report  . Cystoscopy w/ ureteroscopy w/ lithotripsy  02/2003    Left side (Dr. Jeffie Pollock)  . Ureteral stent placement  2011    FAMILY HISTORY: Family History  Problem Relation Age of Onset  . Cancer Mother     breast  . Heart disease Father     CAD/MI in his 27s  . Hypertension Father   . Diabetes Sister     SOCIAL HISTORY:  History   Social History  . Marital Status: Married    Spouse Name: [name]    Number of Children: 1  . Years of Education: 37 th    Occupational History  .      Retired   Social History Main Topics  . Smoking status: Never Smoker   . Smokeless tobacco: Former Systems developer    Quit date: 06/12/2011  . Alcohol Use: No  . Drug Use: No  . Sexual Activity: Not on file   Other Topics Concern  . Not on file   Social History Narrative   Patient lives at home with his wife [name])   Retired.   Education high school   Right handed.   Caffeine Coffee sometimes.     PHYSICAL EXAM   Filed Vitals:   08/01/14 0944  BP: 122/74  Pulse: 76  Height: 5\' 11"  (1.803 m)  Weight: 248 lb (112.492 kg)    Not recorded      Body mass index is 34.6 kg/(m^2).   Generalized: In no acute distress  Neck: Supple, no carotid bruits   Cardiac: Regular rate rhythm  Pulmonary: Clear to auscultation bilaterally  Musculoskeletal: No deformity  Neurological examination  Mentation: Alert oriented to time, place, history taking, and causual conversation  Cranial nerve II-XII: Pupils were equal round reactive to light. Extraocular movements were full.  Visual field were full on confrontational test. Bilateral fundi were sharp.  Facial sensation and strength were normal. Hearing was intact to finger rubbing bilaterally. Uvula tongue midline.  Head turning and shoulder shrug and were normal and symmetric.Tongue protrusion into cheek strength was normal.  Motor: he has mild right on shoulder abduction, external rotation, elbow flexion, wrist extension weakness  Sensory: Intact to fine touch, pinprick, preserved vibratory sensation, and proprioception at toes.  Coordination: Normal finger to nose, heel-to-shin bilaterally there was no truncal ataxia  Gait: Rising up from seated position without assistance, leaning forward, tends to splint his lumbar paraspinal muscles  Romberg signs: Negative  Deep tendon reflexes: Brachioradialis 2/2, biceps 2/2, triceps 2/2, patellar 2/2, Achilles 2/2, plantar responses were flexor  bilaterally.   DIAGNOSTIC DATA (LABS, IMAGING, TESTING) - I reviewed patient records, labs, notes, testing and imaging myself where available.  Lab Results  Component Value Date   WBC 8.2 05/20/2013   HGB 14.5 05/20/2013   HCT 42.1 05/20/2013   MCV 87.5 05/20/2013   PLT 172 05/20/2013      Component Value Date/Time   NA 142 05/20/2013 1510   K 3.8 05/20/2013 1510   CL 103 05/20/2013 1510   CO2 28 05/20/2013 1510   GLUCOSE 172* 05/20/2013 1510   BUN 16 05/20/2013 1510   CREATININE 1.20 05/20/2013 1510   CREATININE 0.91 08/09/2011 1609   CALCIUM 9.5 05/20/2013 1510   PROT 7.0 08/01/2012 1027   ALBUMIN 3.9 08/01/2012 1027   AST 38* 08/01/2012 1027   ALT 64* 08/01/2012 1027   ALKPHOS 97 08/01/2012 1027   BILITOT 1.3* 08/01/2012 1027   GFRNONAA 63* 05/20/2013 1510   GFRAA 73* 05/20/2013 1510   Lab Results  Component Value  Date   CHOL 130 08/01/2012   HDL 32.80* 08/01/2012   LDLCALC 79 08/09/2011   LDLDIRECT 59.0 08/01/2012   TRIG 217.0* 08/01/2012   CHOLHDL 4 08/01/2012   Lab Results  Component Value Date   HGBA1C 13.8* 08/01/2012   Lab Results  Component Value Date   VITAMINB12 634 08/09/2011    ASSESSMENT AND PLAN  MICHELLE VANHISE is a 64 y.o. male complains of worsening bilateral hand tremor, right worse than left, there was also a history of right cervical radiculopathy, mild right arm, proximal and distal weakness,  1, history of memory is most consistent with essential tremor, which has made worse with his right cervical radiculopathy. 2. Sleep apnea, ESS 16, FSS 45, refer him to sleep study. 3. RTC in 6 months.  Orders Placed This Encounter  Procedures  . Ambulatory referral to Sleep Studies    Return in about 6 months (around 01/30/2015).    Marcial Pacas, M.D. Ph.D.  [facility] [address], [place], [place] [address] [phone]

## 2014-09-02 ENCOUNTER — Ambulatory Visit (INDEPENDENT_AMBULATORY_CARE_PROVIDER_SITE_OTHER): Payer: BLUE CROSS/BLUE SHIELD | Admitting: Neurology

## 2014-09-02 DIAGNOSIS — G4761 Periodic limb movement disorder: Secondary | ICD-10-CM

## 2014-09-02 DIAGNOSIS — G4733 Obstructive sleep apnea (adult) (pediatric): Secondary | ICD-10-CM

## 2014-09-02 DIAGNOSIS — G479 Sleep disorder, unspecified: Secondary | ICD-10-CM

## 2014-09-02 NOTE — Sleep Study (Signed)
Please see the scanned sleep study interpretation located in the Procedure tab within the Chart Review section. 

## 2014-09-13 ENCOUNTER — Telehealth: Payer: Self-pay | Admitting: Neurology

## 2014-09-13 NOTE — Telephone Encounter (Signed)
Please call the patient and advised him that his recent sleep study ordered by Dr. Krista Blue showed overall mild obstructive sleep apnea, but he had severe leg twitching and kicking in his sleep which caused sleep disruption. I would like to go over the test results with him in detail during an appointment. Please offer him an appointment for a sleep consultation and we will take it from there - we will talk about restless leg syndrome, and leg twitching and sleep apnea treatment. pls CC report to Drs. Krista Blue and Drema Dallas.

## 2014-09-16 ENCOUNTER — Encounter: Payer: Self-pay | Admitting: Neurology

## 2014-09-20 NOTE — Telephone Encounter (Signed)
Patient was contacted and he scheduled his follow up visit for 10/22/14 at 8:15.  Patient was informed Dr. Rexene Alberts wanted to discuss treatment options for his PLMS.  Dr. Krista Blue was routed an copy and Dr. Leighton Ruff was faxed a copy.

## 2014-10-22 ENCOUNTER — Ambulatory Visit (INDEPENDENT_AMBULATORY_CARE_PROVIDER_SITE_OTHER): Payer: BLUE CROSS/BLUE SHIELD | Admitting: Neurology

## 2014-10-22 ENCOUNTER — Encounter: Payer: Self-pay | Admitting: Neurology

## 2014-10-22 VITALS — BP 146/86 | HR 63 | Resp 18 | Ht 71.0 in | Wt 250.0 lb

## 2014-10-22 DIAGNOSIS — E669 Obesity, unspecified: Secondary | ICD-10-CM

## 2014-10-22 DIAGNOSIS — G4730 Sleep apnea, unspecified: Secondary | ICD-10-CM | POA: Diagnosis not present

## 2014-10-22 DIAGNOSIS — G2581 Restless legs syndrome: Secondary | ICD-10-CM | POA: Diagnosis not present

## 2014-10-22 DIAGNOSIS — G4761 Periodic limb movement disorder: Secondary | ICD-10-CM | POA: Diagnosis not present

## 2014-10-22 MED ORDER — ROPINIROLE HCL 0.25 MG PO TABS: ORAL_TABLET | ORAL | Status: DC

## 2014-10-22 NOTE — Patient Instructions (Signed)
We will start you on medication for restless legs: Requip (generic name: ropinirole) 0.25 mg: Take one pill each night 1 week, then 2 pills each night for 1 week, then 3 pills each night for 1 week, then 4 pills each night thereafter. Take 90-120 minutes before projected bedtime. Common side effects reported are: Sedation, sleepiness, nausea, vomiting, and rare side effects are confusion, hallucinations, swelling in legs, and abnormal behaviors, including impulse control problems, which can manifest as excessive eating, obsessions with food or gambling, or hypersexuality.  You have overall very mild sleep apnea. Try to sleep on your sides and try to lose weight. Please make an appointment with your primary care doctor for routine follow up, including cholesterol, BP and blood sugar management.

## 2014-10-22 NOTE — Progress Notes (Signed)
Subjective:    Patient ID: Micheal Guerra is a 64 y.o. male.  HPI      Star Age, MD, PhD [facility] [address], [address] P.O. [place], [place] [address]  Dear Aliene Beams,   I saw your patient, Micheal Guerra, upon your kind request in my clinic today for initial consultation of his sleep disturbance and after his recent sleep study. The patient is unaccompanied today. As you know, Micheal Guerra is a 64 year old right-handed gentleman with an underlying medical history of essential tremor, obesity, cervical radiculopathy, kidney stones, diabetes and restless leg syndrome and was referred for sleep study due to a report of snoring, excessive daytime somnolence, nocturia and restless leg symptoms. He had a baseline sleep study on 09/02/2014 and I went over his test results with him in detail today. His sleep efficiency was reduced mildly at 87.3% with a latency to sleep of 12.5 minutes and wake after sleep onset of 41.5 minutes with moderate sleep fragmentation noted. He had an elevated arousal index secondary to periodic leg movements. He had a mildly increased percentage of stage II sleep, decreased percentage of REM sleep and a  normal percentage of deep sleep. He had a prolonged REM latency. Severe periodic leg movements were noted at 106.8 times per hour with 7.1 arousals per hour. He had no significant EKG or EEG changes. He had mild intermittent snoring. Total AHI was mildly elevated at 6.9 per hour rising to 19 per hour during REM sleep and 41.3 per hour in the supine position. He slept mostly on his sides. Average oxygen saturation was 94% with a nadir of 82%. Time below 90% saturation was 3 minutes and 13 seconds. He reports a bedtime between 10 and 11 PM. While he falls asleep okay he has trouble maintaining sleep and often wakes up with leg twitches and restless leg symptoms. Sometimes he has to get out of bed to walk around to relieve his urge to move his  legs. He snored less when he was lighter. A couple of years ago he was diagnosed with diabetes and his sugar values were in the 300s. His A1c was 13 at the time. He was referred to endocrinology. He is currently not on medication but was placed on diabetes medication in the past. He was able to lose weight but has regained some. His thyroid function has been okay. He has never been diagnosed with anemia or iron deficiency. Ferritin level a couple years ago was in the 3 digits. He does not drink much in the way of alcohol. He is a nonsmoker. He drinks caffeine occasionally. He's not aware of any family history of restless leg syndrome. He has a tremor in his upper extremities which is currently not very bothersome. He is not on medication for symptomatic tremor control. He's had low back pain. He says he had a compression fracture secondary to a fall about 6 months ago.  His Past Medical History Is Significant For: Past Medical History  Diagnosis Date  . Hereditary essential tremor   . History of nephrolithiasis   . DDD (degenerative disc disease), lumbar   . Hypogonadism, male 08/10/2011  . Restless legs syndrome 08/09/2011  . Fatty liver disease, nonalcoholic 10/11/270  . Laryngeal nodule 07/2011  . Kidney stones     multiple times  . Diabetes mellitus without complication     His Past Surgical History Is Significant For: Past Surgical History  Procedure Laterality Date  . Lumbar spine surgery  04/2002    ruptured disk  . Inguinal hernia repair  2004  and  2005    bilateral  . Colonoscopy  2007    normal per pt report  . Cystoscopy w/ ureteroscopy w/ lithotripsy  02/2003    Left side (Dr. Jeffie Pollock)  . Ureteral stent placement  2011    His Family History Is Significant For: Family History  Problem Relation Age of Onset  . Cancer Mother     breast  . Heart disease Father     CAD/MI in his 59s  . Hypertension Father   . Diabetes Sister     His Social History Is Significant  For: History   Social History  . Marital Status: Married    Spouse Name: [name]  . Number of Children: 1  . Years of Education: 24 th   Occupational History  .      Retired   Social History Main Topics  . Smoking status: Never Smoker   . Smokeless tobacco: Former Systems developer    Quit date: 06/12/2011  . Alcohol Use: No  . Drug Use: No  . Sexual Activity: Not on file   Other Topics Concern  . None   Social History Narrative   Patient lives at home with his wife [name])   Retired.   Education high school   Right handed.   Caffeine Coffee sometimes.    His Allergies Are:  No Known Allergies:   His Current Medications Are:  Outpatient Encounter Prescriptions as of 10/22/2014  Medication Sig  . ibuprofen (ADVIL,MOTRIN) 200 MG tablet Take 600 mg by mouth every 6 (six) hours as needed. pain  . Multiple Vitamin (MULITIVITAMIN WITH MINERALS) TABS Take 1 tablet by mouth daily.  . naproxen (NAPROSYN) 500 MG tablet as needed.  . ONE TOUCH ULTRA TEST test strip 2 (two) times daily. use for testing  . rOPINIRole (REQUIP) 0.25 MG tablet 1 qHS for 1 week, the 2 qHS for 1 week, then 3 qHS for 1 week, then 4 pills qHS thereafter. Take 90-120 minutes before actual bedtime.  :  Review of Systems:  Out of a complete 14 point review of systems, all are reviewed and negative with the exception of these symptoms as listed below:   Review of Systems  All other systems reviewed and are negative.  Restless sleeper, twitching at night, RLS Sx.   Objective:  Neurologic Exam  Physical Exam Physical Examination:   Filed Vitals:   10/22/14 0810  BP: 146/86  Pulse: 63  Resp: 18    General Examination: The patient is a very pleasant 64 y.o. male in no acute distress. He appears well-developed and well-nourished and well groomed.   HEENT: Normocephalic, atraumatic, pupils are equal, round and reactive to light and accommodation. Funduscopic exam is normal with sharp disc margins noted.  Extraocular tracking is good without limitation to gaze excursion or nystagmus noted. Normal smooth pursuit is noted. Hearing is grossly intact. Tympanic membranes are clear bilaterally. Face is symmetric with normal facial animation and normal facial sensation. Speech is clear with no dysarthria noted. There is no hypophonia. There is no lip, neck/head, jaw or voice tremor. Neck is supple with full range of passive and active motion. There are no carotid bruits on auscultation. Oropharynx exam reveals: moderate mouth dryness, good dental hygiene and mild airway crowding, due to redundant soft palate and larger uvula. Mallampati is class II. Tongue protrudes centrally and palate elevates symmetrically.   Chest: Clear to auscultation without  wheezing, rhonchi or crackles noted.  Heart: S1+S2+0, regular and normal without murmurs, rubs or gallops noted.   Abdomen: Soft, non-tender and non-distended with normal bowel sounds appreciated on auscultation.  Extremities: There is no pitting edema in the distal lower extremities bilaterally. Pedal pulses are intact. He stopped his left big toe a few days ago and it is erythematous and there is a bruise around his big toenail. I have asked him to watch for swelling and increase in bruising under his nail which can become very painful.  Skin: Warm and dry without trophic changes noted. There are no varicose veins.  Musculoskeletal: exam reveals no obvious joint deformities, tenderness or joint swelling or erythema.   Neurologically:  Mental status: The patient is awake, alert and oriented in all 4 spheres. His immediate and remote memory, attention, language skills and fund of knowledge are appropriate. There is no evidence of aphasia, agnosia, apraxia or anomia. Speech is clear with normal prosody and enunciation. Thought process is linear. Mood is normal and affect is normal.  Cranial nerves II - XII are as described above under HEENT exam. In addition:  shoulder shrug is normal with equal shoulder height noted. Motor exam: Normal bulk, strength and tone is noted. There is no drift, resting tremor or rebound. He has a very mild intermittent right more than left postural tremor. Romberg is negative. Reflexes are 2+ throughout. Babinski: Toes are flexor bilaterally. Fine motor skills and coordination: intact with normal finger taps, normal hand movements, normal rapid alternating patting, normal foot taps and normal foot agility.  Cerebellar testing: No dysmetria or intention tremor on finger to nose testing. Heel to shin is unremarkable bilaterally. There is no truncal or gait ataxia.  Sensory exam: intact to light touch, pinprick, vibration, temperature sense in the upper and lower extremities.  Gait, station and balance: He stands with difficulty and reports low back pain and stiffness. His posture is mildly stooped with a kink in his very lower back. He walks without significant problems and turns cautiously however.  Assessment and Plan:   In summary, Micheal Guerra is a very pleasant 64 y.o.-year old male with an underlying medical history of essential tremor, obesity, cervical radiculopathy, kidney stones, diabetes and restless leg syndrome. His recent sleep study was discussed with him in detail today. He has a very mild degree of obstructive sleep apnea for which he is encouraged to try to sleep on his sides and pursue further weight loss. He had severe periodic leg movements and evidence of sleep disruption from PLMS. This is in keeping with his diagnosis of restless leg syndrome. Today, I suggested symptomatic treatment for restless leg syndrome and PLMD. He is furthermore advised to make a follow-up appointment with his primary care physician for monitoring his blood pressure which is borderline today and history of elevated blood sugar values for which he is currently not on any medication. He also had borderline cholesterol values he said last  year. At this juncture, would like to get him started on Requip generic, 0.25 mg strength starting with 1 pill at night for a week and incremental increased up to 3 pills each night. He will take this 90-120 minutes before his actual bedtime. We talked about potential side effects including sedation, and nausea and I wrote his instructions down for him. He has an appointment pending with Hoyle Sauer in 3 months and I suggested he check in with you in about 6 months from now. I will see him back  on an as-needed basis. I answered all his questions today and the patient was in agreement with the above outlined plan. Thank you very much for allowing me to participate in the care of this nice patient.  Sincerely,   Star Age, MD, PhD  I spent 25 minutes in total face-to-face time with the patient, more than 50% of which was spent in counseling and coordination of care, reviewing test results, reviewing medication and discussing or reviewing the diagnosis of RLS and PLMs, its prognosis and treatment options.

## 2015-02-04 ENCOUNTER — Ambulatory Visit: Payer: BLUE CROSS/BLUE SHIELD | Admitting: Nurse Practitioner

## 2015-02-05 ENCOUNTER — Ambulatory Visit: Payer: BLUE CROSS/BLUE SHIELD | Admitting: Nurse Practitioner

## 2015-02-05 ENCOUNTER — Other Ambulatory Visit: Payer: Self-pay | Admitting: Neurology

## 2015-04-14 ENCOUNTER — Other Ambulatory Visit: Payer: Self-pay | Admitting: Neurology

## 2015-04-21 ENCOUNTER — Ambulatory Visit (INDEPENDENT_AMBULATORY_CARE_PROVIDER_SITE_OTHER): Payer: PPO | Admitting: Neurology

## 2015-04-21 ENCOUNTER — Encounter: Payer: Self-pay | Admitting: Neurology

## 2015-04-21 ENCOUNTER — Telehealth: Payer: Self-pay | Admitting: Neurology

## 2015-04-21 VITALS — BP 154/83 | HR 64 | Ht 71.0 in | Wt 246.0 lb

## 2015-04-21 DIAGNOSIS — M5412 Radiculopathy, cervical region: Secondary | ICD-10-CM

## 2015-04-21 DIAGNOSIS — G4761 Periodic limb movement disorder: Secondary | ICD-10-CM | POA: Diagnosis not present

## 2015-04-21 MED ORDER — ROPINIROLE HCL 1 MG PO TABS: 1.0000 mg | ORAL_TABLET | Freq: Every day | ORAL | Status: DC

## 2015-04-21 MED ORDER — ROPINIROLE HCL 1 MG PO TABS: 1.0000 mg | ORAL_TABLET | Freq: Three times a day (TID) | ORAL | Status: DC

## 2015-04-21 MED ORDER — ROPINIROLE HCL 0.25 MG PO TABS: 1.0000 mg | ORAL_TABLET | Freq: Every day | ORAL | Status: DC

## 2015-04-21 NOTE — Progress Notes (Signed)
Chief Complaint  Patient presents with  . Restless Leg Syndrome    He is here with his wife, Micheal Guerra. He is still taking Requip 0.25mg , four tablets at bedtime but feels this dose is no longer controlling his symptoms.  He often wakes up throughout the night.      PATIENT: Micheal Guerra DOB: [birth year]  HISTORICAL  Micheal Guerra is a 64 yr RH male, accompanied by his wife, daughter, referred by his primary care physician Dr. Leighton Ruff for evaluation of worsening right hand tremor, right side neck pain,  He had a past medical history of cervical radiculopathy, questionable cervical myelopathy, was under the Care of [facility] neurosurgeon, had yearly checkup, most recent MRI was November 2014 at [facility], multilevel degenerative disc disease, most severe at C 5 6, C6-7, with moderate right foraminal stenosis mild to moderate canal stenosis. He also had a past medical history of lumbar decompression surgery, glucose intolerance, not on any medication treatment,  He worked at a maintenance job, which require welding, bending down, He noticed bilateral hands tremor  Since 2014, getting worse over the past few months, especially his right hand, is difficult for him to sign, holding his machine, getting difficult to 4 form his job,  He felt off ladder with lumbar fracture in November 2014,no surgeries needed, but ever since the accident, he complains of worsening right-sided neck pain, radiating pain to his right shoulder, right arm, subjective weakness of right hand, and right arm,  He has mild gait difficulty because of low back pain, also has intermittent bilateral anterior thigh area cold achy sensation, tends to get up pacing around if he sits for too long, will trying to go to sleep, was treated with clonazepam 1 mg every night for a while, which was helpful, but complains of sleepiness,  He has frequent nocturnal urination, urinate 7-8 times each night,  His father had  tremor in his old age, to the point of difficulty holding the cup of drink there was no head titubation.  UPDATE Aug 01 2014: He has tried inderal , did not help his tremor much, he feel tired with medications, he has retired in Jan 2016.  He also has loud snoring, excessive day time sleepiness, today's ESS score is 65, Fss score is 45  He continues to have bilateral hand shaking, neck pain, radiating pain to his left shoulder, low back pain, radiating pain to his right leg. Mild gait difficulity due to pain  UPDATE Apr 21 2015: He had sleep study in March 2016, confirmed mild obstructive sleep apnea, significant periodic leg movement disorder, he was put on Requip which does help him, he began to set had symptoms around 7:30 each evening, leg achiness, can sit still, does responding to Requip 0 . 2 5 mg, but he often woke up in the middle of the night with recurrent leg moving, achiness sensation,  He is retired now, enjoying fishing trips, mild right hand tremor,  REVIEW OF SYSTEMS: Full 14 system review of systems performed and notable only for restless leg, frequent awakening  ALLERGIES: No Known Allergies  HOME MEDICATIONS: Current Outpatient Prescriptions on File Prior to Visit  Medication Sig Dispense Refill  . ibuprofen (ADVIL,MOTRIN) 200 MG tablet Take 600 mg by mouth every 6 (six) hours as needed. pain    . Multiple Vitamin (MULITIVITAMIN WITH MINERALS) TABS Take 1 tablet by mouth daily.    . naproxen (NAPROSYN) 500 MG tablet as needed.  11  .  ONE TOUCH ULTRA TEST test strip 2 (two) times daily. use for testing  12  . rOPINIRole (REQUIP) 0.25 MG tablet TAKE 1 TABLET AT BEDTIME FOR 1 WEEK. INCREASE BY 1 TABLET EACH WEEK TIL TAKING 4 TABS AT BEDTIME 120 tablet 0   No current facility-administered medications on file prior to visit.    PAST MEDICAL HISTORY: Past Medical History  Diagnosis Date  . Hereditary essential tremor   . History of nephrolithiasis   . DDD  (degenerative disc disease), lumbar   . Hypogonadism, male 08/10/2011  . Restless legs syndrome 08/09/2011  . Fatty liver disease, nonalcoholic 1/75/1025  . Laryngeal nodule 07/2011  . Kidney stones     multiple times  . Diabetes mellitus without complication ([place])     PAST SURGICAL HISTORY: Past Surgical History  Procedure Laterality Date  . Lumbar spine surgery  04/2002    ruptured disk  . Inguinal hernia repair  64  and  2005    bilateral  . Colonoscopy  2007    normal per pt report  . Cystoscopy w/ ureteroscopy w/ lithotripsy  02/2003    Left side (Dr. Jeffie Pollock)  . Ureteral stent placement  2011    FAMILY HISTORY: Family History  Problem Relation Age of Onset  . Cancer Mother     breast  . Heart disease Father     CAD/MI in his 42s  . Hypertension Father   . Diabetes Sister     SOCIAL HISTORY:  Social History   Social History  . Marital Status: Married    Spouse Name: Micheal Guerra  . Number of Children: 1  . Years of Education: 57 th   Occupational History  .      Retired   Social History Main Topics  . Smoking status: Never Smoker   . Smokeless tobacco: Former Systems developer    Quit date: 06/12/2011  . Alcohol Use: No  . Drug Use: No  . Sexual Activity: Not on file   Other Topics Concern  . Not on file   Social History Narrative   Patient lives at home with his wife Micheal Guerra)   Retired.   Education high school   Right handed.   Caffeine Coffee sometimes.     PHYSICAL EXAM   Filed Vitals:   04/21/15 0913  BP: 154/83  Pulse: 64  Height: 5\' 11"  (1.803 m)  Weight: 246 lb (111.585 kg)    Not recorded      Body mass index is 34.33 kg/(m^2).   Generalized: In no acute distress  Neck: Supple, no carotid bruits   Cardiac: Regular rate rhythm  Pulmonary: Clear to auscultation bilaterally  Musculoskeletal: No deformity  Neurological examination  Mentation: Alert oriented to time, place, history taking, and causual conversation  Cranial nerve  II-XII: Pupils were equal round reactive to light. Extraocular movements were full.  Visual field were full on confrontational test. Bilateral fundi were sharp.  Facial sensation and strength were normal. Hearing was intact to finger rubbing bilaterally. Uvula tongue midline.  Head turning and shoulder shrug and were normal and symmetric.Tongue protrusion into cheek strength was normal.  Motor: he has mild right on shoulder abduction, external rotation, elbow flexion, wrist extension weakness  Sensory: Intact to fine touch, pinprick, preserved vibratory sensation, and proprioception at toes.  Coordination: Normal finger to nose, heel-to-shin bilaterally there was no truncal ataxia  Gait: Rising up from seated position without assistance, leaning forward, tends to splint his lumbar paraspinal muscles  Romberg signs:  Negative  Deep tendon reflexes: Brachioradialis 2/2, biceps 2/2, triceps 2/2, patellar 2/2, Achilles 2/2, plantar responses were flexor bilaterally.   DIAGNOSTIC DATA (LABS, IMAGING, TESTING) - I reviewed patient records, labs, notes, testing and imaging myself where available.  Lab Results  Component Value Date   WBC 8.2 05/20/2013   HGB 14.5 05/20/2013   HCT 42.1 05/20/2013   MCV 87.5 05/20/2013   PLT 172 05/20/2013      Component Value Date/Time   NA 142 05/20/2013 1510   K 3.8 05/20/2013 1510   CL 103 05/20/2013 1510   CO2 28 05/20/2013 1510   GLUCOSE 172* 05/20/2013 1510   BUN 16 05/20/2013 1510   CREATININE 1.20 05/20/2013 1510   CREATININE 0.91 08/09/2011 1609   CALCIUM 9.5 05/20/2013 1510   PROT 7.0 08/01/2012 1027   ALBUMIN 3.9 08/01/2012 1027   AST 38* 08/01/2012 1027   ALT 64* 08/01/2012 1027   ALKPHOS 97 08/01/2012 1027   BILITOT 1.3* 08/01/2012 1027   GFRNONAA 63* 05/20/2013 1510   GFRAA 73* 05/20/2013 1510   Lab Results  Component Value Date   CHOL 130 08/01/2012   HDL 32.80* 08/01/2012   LDLCALC 79 08/09/2011   LDLDIRECT 59.0 08/01/2012    TRIG 217.0* 08/01/2012   CHOLHDL 4 08/01/2012   Lab Results  Component Value Date   HGBA1C 13.8* 08/01/2012   Lab Results  Component Value Date   VITAMINB12 634 08/09/2011    ASSESSMENT AND PLAN  Micheal Guerra is a 64 y.o. male complains of worsening bilateral hand tremor, right worse than left, there was also a history of right cervical radiculopathy, mild right arm, proximal and distal weakness, Periodic leg movement disorder  Keep Requip 1 mg every night, add on Requip 0.25 milligrams 1-4 tablets evening time, Essential tremor  Continued to worse, may consider Inderal Right cervical radiculopathy  Marcial Pacas, M.D. Ph.D.  [facility] [address], [place], [place] [address] [phone]

## 2015-04-21 NOTE — Telephone Encounter (Signed)
OV Note says: Keep Requip 1 mg every night, add on Requip 0.25 milligrams 1-4 tablets evening time I called back. Spoke with Micheal Guerra and related providers note.  She expressed understanding ad they will proceed with orders.

## 2015-04-21 NOTE — Telephone Encounter (Signed)
Micheal Guerra with [facility] in [place] called stating she received 3 refills for Requip: 1st one is .25mg  taking 4 tabs at bedtime... 2nd is  1mg  tab taking 3 times/day.. 3rd is 1mg  taking 1 tab at bedtime. She needs confirmation on correct refill. She can be reached at [phone]

## 2015-04-29 ENCOUNTER — Ambulatory Visit: Payer: Self-pay | Admitting: Neurology

## 2015-04-29 ENCOUNTER — Ambulatory Visit: Payer: BLUE CROSS/BLUE SHIELD | Admitting: Neurology

## 2015-07-31 ENCOUNTER — Other Ambulatory Visit: Payer: Self-pay | Admitting: *Deleted

## 2015-07-31 MED ORDER — ROPINIROLE HCL 1 MG PO TABS: 1.0000 mg | ORAL_TABLET | Freq: Every day | ORAL | Status: DC

## 2015-10-13 ENCOUNTER — Ambulatory Visit: Payer: PPO | Admitting: Neurology

## 2015-10-20 ENCOUNTER — Ambulatory Visit: Payer: PPO | Admitting: Neurology

## 2015-10-30 ENCOUNTER — Ambulatory Visit (INDEPENDENT_AMBULATORY_CARE_PROVIDER_SITE_OTHER): Payer: PPO | Admitting: Neurology

## 2015-10-30 ENCOUNTER — Encounter: Payer: Self-pay | Admitting: Neurology

## 2015-10-30 VITALS — BP 152/78 | HR 71 | Ht 71.0 in | Wt 242.0 lb

## 2015-10-30 DIAGNOSIS — G2581 Restless legs syndrome: Secondary | ICD-10-CM | POA: Diagnosis not present

## 2015-10-30 DIAGNOSIS — E1142 Type 2 diabetes mellitus with diabetic polyneuropathy: Secondary | ICD-10-CM | POA: Diagnosis not present

## 2015-10-30 DIAGNOSIS — R251 Tremor, unspecified: Secondary | ICD-10-CM

## 2015-10-30 MED ORDER — GABAPENTIN 100 MG PO CAPS: 300.0000 mg | ORAL_CAPSULE | Freq: Three times a day (TID) | ORAL | Status: DC

## 2015-10-30 NOTE — Progress Notes (Signed)
Chief Complaint  Patient presents with  . Periodic limb movement disorder    rm 5," used to be just at night but now it happens in the evenings too"  . Follow-up    6 month      PATIENT: Micheal Guerra DOB: 1950-11-23  HISTORICAL  Micheal Guerra is a 24 yr RH male, accompanied by his wife, daughter, referred by his primary care physician Dr. Leighton Ruff for evaluation of worsening right hand tremor, right side neck pain,  He had a past medical history of cervical radiculopathy, questionable cervical myelopathy, was under the Care of [facility] neurosurgeon, had yearly checkup, most recent MRI was November 2014 at [facility], multilevel degenerative disc disease, most severe at C 5-6, C6-7, with moderate right foraminal stenosis mild to moderate canal stenosis. He also had a past medical history of lumbar decompression surgery, glucose intolerance, not on any medication treatment,  He worked at a maintenance job, which require welding, bending down, He noticed bilateral hands tremor  Since 2014, getting worse over the past few months, especially his right hand, is difficult for him to sign, holding his machine, getting difficult to 4 form his job,  He felt off ladder with lumbar fracture in November 2014,no surgeries needed, but ever since the accident, he complains of worsening right-sided neck pain, radiating pain to his right shoulder, right arm, subjective weakness of right hand, and right arm,  He has mild gait difficulty because of low back pain, also has intermittent bilateral anterior thigh area cold achy sensation, tends to get up pacing around if he sits for too long, will trying to go to sleep, was treated with clonazepam 1 mg every night for a while, which was helpful, but complains of sleepiness,  He has frequent nocturnal urination, urinate 7-8 times each night,  His father had tremor in his old age, to the point of difficulty holding the cup of drink there was no  head titubation.  UPDATE Aug 01 2014: He has tried inderal , did not help his tremor much, he feel tired with medications, he has retired in Jan 2016.  He also has loud snoring, excessive day time sleepiness, today's ESS score is 16, Fss score is 45  He continues to have bilateral hand shaking, neck pain, radiating pain to his left shoulder, low back pain, radiating pain to his right leg. Mild gait difficulity due to pain  UPDATE Apr 21 2015: He had sleep study in March 2016, confirmed mild obstructive sleep apnea, significant periodic leg movement disorder, he was put on Requip which does help him, he began to have symptoms around 7:30 pm, leg achiness, can not sit still, does responding to Requip 0 . 2 5 mg, but he often woke up in the middle of the night with recurrent leg moving, achiness sensation,  He is retired now, enjoying fishing trips, mild right hand tremor,  UPDATE October 30 2015: I reviewed laboratory results, ferritin was 285 in 2013, for a while his diabetes was out of control, there are A1c up to 13 point 8 in 2014, now his diabetes under much better control, average morning glucose level was around 130 to 140 range, he does has bilateral feet paresthesia, coldness sensation, he denies gait difficulty, he often feel tired during the day, mild low back pain, no significant gait difficulty  REVIEW OF SYSTEMS: Full 14 system review of systems performed and notable only for back pain, neck stiffness, restless leg  ALLERGIES: No Known  Allergies  HOME MEDICATIONS: Current Outpatient Prescriptions on File Prior to Visit  Medication Sig Dispense Refill  . ibuprofen (ADVIL,MOTRIN) 200 MG tablet Take 600 mg by mouth every 6 (six) hours as needed. pain    . Multiple Vitamin (MULITIVITAMIN WITH MINERALS) TABS Take 1 tablet by mouth daily.    . ONE TOUCH ULTRA TEST test strip 2 (two) times daily. use for testing  12  . rOPINIRole (REQUIP) 0.25 MG tablet Take 4 tablets (1 mg total) by  mouth at bedtime. 120 tablet 6  . rOPINIRole (REQUIP) 1 MG tablet Take 1 tablet (1 mg total) by mouth at bedtime. 90 tablet 1  . naproxen (NAPROSYN) 500 MG tablet as needed. Reported on 10/30/2015  11   No current facility-administered medications on file prior to visit.    PAST MEDICAL HISTORY: Past Medical History  Diagnosis Date  . Hereditary essential tremor   . History of nephrolithiasis   . DDD (degenerative disc disease), lumbar   . Hypogonadism, male 08/10/2011  . Restless legs syndrome 08/09/2011  . Fatty liver disease, nonalcoholic [id number]  . Laryngeal nodule 07/2011  . Kidney stones     multiple times  . Diabetes mellitus without complication ([place])     PAST SURGICAL HISTORY: Past Surgical History  Procedure Laterality Date  . Lumbar spine surgery  04/2002    ruptured disk  . Inguinal hernia repair  2004  and  2005    bilateral  . Colonoscopy  2007    normal per pt report  . Cystoscopy w/ ureteroscopy w/ lithotripsy  02/2003    Left side (Dr. Jeffie Pollock)  . Ureteral stent placement  2011    FAMILY HISTORY: Family History  Problem Relation Age of Onset  . Cancer Mother     breast  . Heart disease Father     CAD/MI in his 33s  . Hypertension Father   . Diabetes Sister     SOCIAL HISTORY:  Social History   Social History  . Marital Status: Married    Spouse Name: [name]  . Number of Children: 1  . Years of Education: 2 th   Occupational History  .      Retired   Social History Main Topics  . Smoking status: Never Smoker   . Smokeless tobacco: Former Systems developer    Quit date: 06/12/2011  . Alcohol Use: No  . Drug Use: No  . Sexual Activity: Not on file   Other Topics Concern  . Not on file   Social History Narrative   Patient lives at home with his wife [name])   Retired.   Education high school   Right handed.   Caffeine Coffee sometimes.     PHYSICAL EXAM   Filed Vitals:   10/30/15 0759  BP: 152/78  Pulse: 71  Height: 5\' 11"  (1.803  m)  Weight: 242 lb (109.77 kg)    Not recorded      Body mass index is 33.77 kg/(m^2).   Generalized: In no acute distress  Neck: Supple, no carotid bruits   Cardiac: Regular rate rhythm  Pulmonary: Clear to auscultation bilaterally  Musculoskeletal: No deformity  Neurological examination  Mentation: Alert oriented to time, place, history taking, and causual conversation  Cranial nerve II-XII: Pupils were equal round reactive to light. Extraocular movements were full.  Visual field were full on confrontational test. Bilateral fundi were sharp.  Facial sensation and strength were normal. Hearing was intact to finger rubbing bilaterally. Uvula tongue midline.  Head turning and shoulder shrug and were normal and symmetric.Tongue protrusion into cheek strength was normal.  Motor: he has mild right on shoulder abduction, external rotation, elbow flexion, wrist extension weakness  Sensory: Mildly length dependent decreased light touch pinprick at distal leg   Coordination: Normal finger to nose, heel-to-shin bilaterally there was no truncal ataxia  Gait: Rising up from seated position without assistance, leaning forward, tends to splint his lumbar paraspinal muscles  Romberg signs: Negative  Deep tendon reflexes: Brachioradialis 2/2, biceps 2/2, triceps 2/2, patellar 2/2, Achilles 2/2, plantar responses were flexor bilaterally.   DIAGNOSTIC DATA (LABS, IMAGING, TESTING) - I reviewed patient records, labs, notes, testing and imaging myself where available.  Lab Results  Component Value Date   WBC 8.2 05/20/2013   HGB 14.5 05/20/2013   HCT 42.1 05/20/2013   MCV 87.5 05/20/2013   PLT 172 05/20/2013      Component Value Date/Time   NA 142 05/20/2013 1510   K 3.8 05/20/2013 1510   CL 103 05/20/2013 1510   CO2 28 05/20/2013 1510   GLUCOSE 172* 05/20/2013 1510   BUN 16 05/20/2013 1510   CREATININE 1.20 05/20/2013 1510   CREATININE 0.91 08/09/2011 1609   CALCIUM 9.5  05/20/2013 1510   PROT 7.0 08/01/2012 1027   ALBUMIN 3.9 08/01/2012 1027   AST 38* 08/01/2012 1027   ALT 64* 08/01/2012 1027   ALKPHOS 97 08/01/2012 1027   BILITOT 1.3* 08/01/2012 1027   GFRNONAA 63* 05/20/2013 1510   GFRAA 73* 05/20/2013 1510   Lab Results  Component Value Date   CHOL 130 08/01/2012   HDL 32.80* 08/01/2012   LDLCALC 79 08/09/2011   LDLDIRECT 59.0 08/01/2012   TRIG 217.0* 08/01/2012   CHOLHDL 4 08/01/2012   Lab Results  Component Value Date   HGBA1C 13.8* 08/01/2012   Lab Results  Component Value Date   VITAMINB12 634 08/09/2011    ASSESSMENT AND PLAN  ABRON HAQ is a 65 y.o. male complains of worsening bilateral hand tremor, right worse than left, there was also a history of right cervical radiculopathy, mild right arm, proximal and distal weakness, Restless leg syndrome  Keep Requip 1 mg every night,   Add on gabapentin 300 mg 3 times a day  Diabetic peripheral neuropathy   Advised patient continue tight control of his diabetes  Essential tremor  If he continued to have worsening bilateral hands tremor, may consider propanolol  Right cervical radiculopathy  Stable no significant neck pain no detectable arm weakness   Marcial Pacas, M.D. Ph.D.  [facility] [address] [place], [place] [address] [phone]

## 2016-03-01 ENCOUNTER — Ambulatory Visit: Payer: PPO | Admitting: Neurology

## 2016-04-06 ENCOUNTER — Encounter (HOSPITAL_COMMUNITY): Payer: Self-pay | Admitting: *Deleted

## 2016-04-06 ENCOUNTER — Other Ambulatory Visit: Payer: Self-pay | Admitting: Urology

## 2016-04-08 ENCOUNTER — Ambulatory Visit (HOSPITAL_COMMUNITY): Payer: PPO

## 2016-04-08 ENCOUNTER — Encounter (HOSPITAL_COMMUNITY)
Admission: RE | Disposition: A | Discharge status: Home / Self Care | Payer: Self-pay | Source: Ambulatory Visit | Attending: Urology

## 2016-04-08 ENCOUNTER — Ambulatory Visit (HOSPITAL_COMMUNITY)
Admission: RE | Admit: 2016-04-08 | Discharge: 2016-04-08 | Disposition: A | Discharge status: Home / Self Care | Payer: PPO | Source: Ambulatory Visit | Attending: Urology | Admitting: Urology

## 2016-04-08 ENCOUNTER — Encounter (HOSPITAL_COMMUNITY): Payer: Self-pay | Admitting: *Deleted

## 2016-04-08 DIAGNOSIS — Z6832 Body mass index (BMI) 32.0-32.9, adult: Secondary | ICD-10-CM | POA: Diagnosis not present

## 2016-04-08 DIAGNOSIS — Z833 Family history of diabetes mellitus: Secondary | ICD-10-CM | POA: Diagnosis not present

## 2016-04-08 DIAGNOSIS — R7303 Prediabetes: Secondary | ICD-10-CM | POA: Diagnosis not present

## 2016-04-08 DIAGNOSIS — E669 Obesity, unspecified: Secondary | ICD-10-CM | POA: Insufficient documentation

## 2016-04-08 DIAGNOSIS — N201 Calculus of ureter: Secondary | ICD-10-CM | POA: Insufficient documentation

## 2016-04-08 DIAGNOSIS — Z803 Family history of malignant neoplasm of breast: Secondary | ICD-10-CM | POA: Diagnosis not present

## 2016-04-08 DIAGNOSIS — Z87442 Personal history of urinary calculi: Secondary | ICD-10-CM | POA: Diagnosis not present

## 2016-04-08 DIAGNOSIS — Z7984 Long term (current) use of oral hypoglycemic drugs: Secondary | ICD-10-CM | POA: Diagnosis not present

## 2016-04-08 HISTORY — DX: Mononeuropathy, unspecified: G58.9

## 2016-04-08 LAB — GLUCOSE, CAPILLARY: Glucose-Capillary: 173 mg/dL — ABNORMAL HIGH (ref 65–99)

## 2016-04-08 SURGERY — LITHOTRIPSY, ESWL
Anesthesia: LOCAL | Laterality: Right

## 2016-04-08 MED ORDER — DIAZEPAM 5 MG PO TABS
10.0000 mg | ORAL_TABLET | ORAL | Status: AC
  Administered 2016-04-08: 10 mg via ORAL
  Filled 2016-04-08: qty 2

## 2016-04-08 MED ORDER — SODIUM CHLORIDE 0.9 % IV SOLN
INTRAVENOUS | Status: DC
  Administered 2016-04-08: 09:00:00 via INTRAVENOUS

## 2016-04-08 MED ORDER — DIPHENHYDRAMINE HCL 25 MG PO CAPS
25.0000 mg | ORAL_CAPSULE | ORAL | Status: AC
  Administered 2016-04-08: 25 mg via ORAL
  Filled 2016-04-08: qty 1

## 2016-04-08 MED ORDER — CEFAZOLIN SODIUM-DEXTROSE 2-4 GM/100ML-% IV SOLN
2.0000 g | INTRAVENOUS | Status: AC
  Administered 2016-04-08: 2 g via INTRAVENOUS
  Filled 2016-04-08 (×2): qty 100

## 2016-04-08 NOTE — Interval H&P Note (Signed)
History and Physical Interval Note:  04/08/2016 10:25 AM  Micheal Guerra  has presented today for surgery, with the diagnosis of RIGHT PROXIMAL URETERAL STONE  The various methods of treatment have been discussed with the patient and family. After consideration of risks, benefits and other options for treatment, the patient has consented to  Procedure(s): RIGHT EXTRACORPOREAL SHOCK WAVE LITHOTRIPSY (ESWL) (Right) as a surgical intervention .  The patient's history has been reviewed, patient examined, no change in status, stable for surgery.  I have reviewed the patient's chart and labs.  Questions were answered to the patient's satisfaction.     [name] I [name]

## 2016-04-08 NOTE — H&P (Signed)
Office Visit Report     04/05/2016   --------------------------------------------------------------------------------   Micheal Guerra  MRN: [id number]  PRIMARY CARE:  Shawnie Dapper, MD  DOB: 1951-07-06, 65 year old Male  REFERRING:    SSN: [id number]  PROVIDER:  Irine Seal, M.D.    TREATING:  Rana Snare, M.D.    LOCATION:  [facility] [phone]   --------------------------------------------------------------------------------   CC: I have kidney stones.  HPI: Micheal Guerra is a 65 year-old male established patient who is here for renal calculi.  The problem is on the right side. . This is not his first kidney stone. He has had more than 5 stones prior to getting this one. He is currently having back pain. He denies having flank pain, groin pain, nausea, vomiting, fever, and chills. He has not caught a stone in his urine strainer since his symptoms began.   He has had eswl, ureteral stent, and ureteroscopy for treatment of his stones in the past.   Long-standing history of recurrent urolithiasis with multiple ESWL as well as endoscopic procedures. He has done fairly well dating back to 2013 which was when he had his last go-round requiring intervention. Since that time his past 4-5 small stones without incident. He now having typical right-sided renal colic.   Voiding ok Small hgb on dipstick     ALLERGIES: No Allergies    MEDICATIONS: Metformin Hcl 500 mg tablet  Fish Oil CAPS Oral  Multi-Day Vitamins TABS Oral  Ropinirole Er 12 mg tablet, extended release 24 hr     GU PSH: Cystoscopy Ureteroscopy - 2008 Renal ESWL - 2013, 2009      [place] Notes: Lithotripsy, Lithotripsy, Cystoscopy With Ureteroscopy Left, Inguinal Hernia Repair, Back Surgery   NON-GU PSH: None   GU PMH: Kidney Stone, Bilateral kidney stones - 05/01/2014 Bladder Stone, Bladder calculus - 2014 BPH w/o LUTS, Benign prostatic hypertrophy without lower urinary tract symptoms -  2014 Calculus Ureter, Proximal Ureteral Stone On The Left - 2014, Calculus of distal right ureter, - 2014, Calculus of ureter, - 2014 Flank Pain, Generalized abdominal pain - 2014 Gross hematuria, Gross hematuria - 2014 Hydronephrosis Unspec, Hydronephrosis, left - 2014 Nocturia, Nocturia - 2014 Personal Hx urinary calculi, Nephrolithiasis - 123456 Renal Colic, Renal colic - 123456      PMH Notes:  2011-10-25 09:11:27 - Note: Flank Pain Left   NON-GU PMH: Encounter for general adult medical examination without abnormal findings, Encounter for preventive health examination - 05/01/2014 Stable burst fracture of unspecified lumbar vertebra, initial encounter for closed fracture, Lumbar burst fracture - 05/01/2014 Personal history of other diseases of the digestive system, History of esophageal reflux - 2014 Personal history of other endocrine, nutritional and metabolic disease, History of diabetes mellitus - 2014    FAMILY HISTORY: Breast Cancer - Mother Diabetes - Grandfather, Sister Family Health Status Number - Mother, Runs In Family Heart Disease - Father Hematuria - Father nephrolithiasis - Father   SOCIAL HISTORY: Marital Status: Married Current Smoking Status: Patient has never smoked.  Has never drank.  Drinks 4+ caffeinated drinks per day. Patient's occupation is/was Retired.    REVIEW OF SYSTEMS:    GU Review Male:   Patient denies frequent urination, hard to postpone urination, burning/ pain with urination, get up at night to urinate, leakage of urine, stream starts and stops, trouble starting your stream, have to strain to urinate , erection problems, and penile pain.  Gastrointestinal (Upper):   Patient denies nausea, vomiting, and  indigestion/ heartburn.  Gastrointestinal (Lower):   Patient denies diarrhea and constipation.  Constitutional:   Patient denies fever, weight loss, night sweats, and fatigue.  Skin:   Patient denies skin rash/ lesion and itching.  Eyes:    Patient denies blurred vision and double vision.  Ears/ Nose/ Throat:   Patient denies sore throat and sinus problems.  Hematologic/Lymphatic:   Patient denies swollen glands and easy bruising.  Cardiovascular:   Patient denies leg swelling and chest pains.  Respiratory:   Patient denies cough and shortness of breath.  Endocrine:   Patient denies excessive thirst.  Musculoskeletal:   Patient reports back pain. Patient denies joint pain.  Neurological:   Patient denies headaches and dizziness.  Psychologic:   Patient denies depression and anxiety.   VITAL SIGNS:      04/05/2016 02:04 PM  Weight 235 lb / 106.59 kg  Height 71 in / 180.34 cm  BP 157/87 mmHg  Pulse 69 /min  Temperature 97.7 F / 36 C  BMI 32.8 kg/m   GU PHYSICAL EXAMINATION:    Anus and Perineum: No hemorrhoids. No anal stenosis. No rectal fissure, no anal fissure. No edema, no dimple, no perineal tenderness, no anal tenderness.  Scrotum: No lesions. No edema. No cysts. No warts.  Epididymides: Right: no spermatocele, no masses, no cysts, no tenderness, no induration, no enlargement. Left: no spermatocele, no masses, no cysts, no tenderness, no induration, no enlargement.  Testes: No tenderness, no swelling, no enlargement left testes. No tenderness, no swelling, no enlargement right testes. Normal location left testes. Normal location right testes. No mass, no cyst, no varicocele, no hydrocele left testes. No mass, no cyst, no varicocele, no hydrocele right testes.  Urethral Meatus: Normal size. No lesion, no wart, no discharge, no polyp. Normal location.  Penis: Circumcised, no foreskin warts, no cracks. No dorsal peyronie's plaques, no left corporal peyronie's plaques, no right corporal peyronie's plaques, no scarring, no shaft warts. No balanitis, no meatal stenosis.   Prostate: Prostate about 40 grams. Left lobe normal consistency, right lobe normal consistency. Symmetrical lobes. No prostate nodule. Left lobe no  tenderness, right lobe no tenderness.   Seminal Vesicles: Nonpalpable.  Sphincter Tone: Normal sphincter. No rectal tenderness. No rectal mass.    MULTI-SYSTEM PHYSICAL EXAMINATION:    Constitutional: Well-nourished. No physical deformities. Normally developed. Good grooming.  Neck: Neck symmetrical, not swollen. Normal tracheal position.  Respiratory: No labored breathing, no use of accessory muscles.   Cardiovascular: Normal temperature, normal extremity pulses, no swelling, no varicosities.  Neurologic / Psychiatric: Oriented to time, oriented to place, oriented to person. No depression, no anxiety, no agitation.  Gastrointestinal: No mass, no tenderness, no rigidity, non obese abdomen.  Musculoskeletal: Normal gait and station of head and neck.     PAST DATA REVIEWED:  Source Of History:  Patient   05/16/07 01/07/03  PSA  Total PSA 1.21  0.71     PROCEDURES:         C.T. Urogram - [id number]               Urinalysis w/Scope - 81001 Dipstick Dipstick Cont'd Micro  Specimen: Voided Bilirubin: Neg WBC/hpf: 0-5/hpf  Color: Yellow Ketones: Neg RBC/hpf: 0-2/hpf  Appearance: Clear Blood: 1+ Bacteria: NS (Not Seen)  Specific Gravity: 1.025 Protein: Neg Cystals: NS (Not Seen)  pH: 5.0 Urobilinogen: 0.2 Casts: NS (Not Seen)  Glucose: Neg Nitrites: Neg Trichomonas: Not Present    Leukocyte Esterase: Neg Mucous: Not Present  Epithelial Cells: 0-5/hpf      Yeast: NS (Not Seen)      Sperm: Not Present    ASSESSMENT:      ICD-10 Details  1 GU:   Flank Pain - R10.84   2   BPH w/o LUTS - N40.0   3   Other microscopic hematuria - R31.29   4   Calculus Kidney and Ureter - N20.2    PLAN:            Medications New Meds: Vicodin 5 mg-300 mg tablet 1-2 tablet PO Q 6 H   #30  0 Refill(s)            Orders X-Rays: C.T. Stone Protocol Without Contrast  X-Ray Notes: History:  Hematuria: Yes/No  Patient to see MD after exam: Yes/No  Previous exam: CT / IVP/ US/ KUB/  None  When:  Where:  Diabetic: Yes/ No  BUN/ Creatinine:  Date of last BUN Creatinine:  Weight in pounds:  Allergy- IV Contrast: Yes/ No  Conflicting diabetic meds: Yes/ No  Diabetic Meds:  Prior Authorization #: [id number]           Schedule Return Visit: 1 Week - Schedule Surgery          Document Letter(s):  Created for Patient: Clinical Summary         Notes:   mISTER Stebner UNDERWENT STONE PROTOCOL ct IMAGING.  hE IS NOTED TO HAVE A 70 MM PROXIMAL RIGHT URETERAL STONE CAUSING HYDRONEPHROSIS. tHERE IS A SMALLER RIGHT RENAL STONE AS WELL. iN THE THE LEFT KIDNEY MEASURES 11 MM STONE AND A SMALLER LEFT RENAL STONE AS WELL.   wE WILL REVIEW THE OFFICIAL REPORT FROM THE RADIOLOGIST. hE IS TOLD THE STONE WOULD HAVE APPROXIMATELY A 20% CHANCE OF PASSING SPONTANEOUSLY. hE IS INTERESTED IN HER INTERVENTION. wE DISCUSSED THE PROS AND CONS OF URETEROSCOPY VERSUS eswl. hE HAS HAD BOTH TREATMENTS IN THE PAST. wE ARE GOING TRY TO PUT HIM ON eswl SCHEDULED FOR tHURSDAY OF THIS WEEK OR mONDAY OF NEXT WEEK.       Signed by Rana Snare, M.D. on 04/05/16 at 3:28 PM (EDT)     The information contained in this medical record document is considered private and confidential patient information. This information can only be used for the medical diagnosis and/or medical services that are being provided by the patient's selected caregivers. This information can only be distributed outside of the patient's care if the patient agrees and signs waivers of authorization for this information to be sent to an outside source or route.

## 2016-04-08 NOTE — Discharge Instructions (Signed)
Dietary Guidelines to Help Prevent Kidney Stones Your risk of kidney stones can be decreased by adjusting the foods you eat. The most important thing you can do is drink enough fluid. You should drink enough fluid to keep your urine clear or pale yellow. The following guidelines provide specific information for the type of kidney stone you have had. GUIDELINES ACCORDING TO TYPE OF KIDNEY STONE Calcium Oxalate Kidney Stones  Reduce the amount of salt you eat. Foods that have a lot of salt cause your body to release excess calcium into your urine. The excess calcium can combine with a substance called oxalate to form kidney stones.  Reduce the amount of animal protein you eat if the amount you eat is excessive. Animal protein causes your body to release excess calcium into your urine. Ask your dietitian how much protein from animal sources you should be eating.  Avoid foods that are high in oxalates. If you take vitamins, they should have less than 500 mg of vitamin C. Your body turns vitamin C into oxalates. You do not need to avoid fruits and vegetables high in vitamin C. Calcium Phosphate Kidney Stones  Reduce the amount of salt you eat to help prevent the release of excess calcium into your urine.  Reduce the amount of animal protein you eat if the amount you eat is excessive. Animal protein causes your body to release excess calcium into your urine. Ask your dietitian how much protein from animal sources you should be eating.  Get enough calcium from food or take a calcium supplement (ask your dietitian for recommendations). Food sources of calcium that do not increase your risk of kidney stones include:  Broccoli.  Dairy products, such as cheese and yogurt.  Pudding. Uric Acid Kidney Stones  Do not have more than 6 oz of animal protein per day. FOOD SOURCES Animal Protein Sources  Meat (all types).  Poultry.  Eggs.  Fish, seafood. Foods High in Salt  Salt seasonings.  Soy  sauce.  Teriyaki sauce.  Cured and processed meats.  Salted crackers and snack foods.  Fast food.  Canned soups and most canned foods. Foods High in Oxalates  Grains:  Amaranth.  Barley.  Grits.  Wheat germ.  Bran.  Buckwheat flour.  All bran cereals.  Pretzels.  Whole wheat bread.  Vegetables:  Beans (wax).  Beets and beet greens.  Collard greens.  Eggplant.  Escarole.  Leeks.  Okra.  Parsley.  Rutabagas.  Spinach.  Swiss chard.  Tomato paste.  Fried potatoes.  Sweet potatoes.  Fruits:  Red currants.  Figs.  Kiwi.  Rhubarb.  Meat and Other Protein Sources:  Beans (dried).  Soy burgers and other soybean products.  Miso.  Nuts (peanuts, almonds, pecans, cashews, hazelnuts).  Nut butters.  Sesame seeds and tahini (paste made of sesame seeds).  Poppy seeds.  Beverages:  Chocolate drink mixes.  Soy milk.  Instant iced tea.  Juices made from high-oxalate fruits or vegetables.  Other:  Carob.  Chocolate.  Fruitcake.  Marmalades.   This information is not intended to replace advice given to you by your health care provider. Make sure you discuss any questions you have with your health care provider.   Document Released: 10/23/2010 Document Revised: 07/03/2013 Document Reviewed: 05/25/2013 Elsevier Interactive Patient Education 2016 [employer]  

## 2016-04-19 ENCOUNTER — Ambulatory Visit (INDEPENDENT_AMBULATORY_CARE_PROVIDER_SITE_OTHER): Payer: PPO | Admitting: Neurology

## 2016-04-19 ENCOUNTER — Encounter: Payer: Self-pay | Admitting: Neurology

## 2016-04-19 VITALS — BP 135/82 | HR 59 | Ht 71.0 in | Wt 232.0 lb

## 2016-04-19 DIAGNOSIS — G250 Essential tremor: Secondary | ICD-10-CM

## 2016-04-19 DIAGNOSIS — E1142 Type 2 diabetes mellitus with diabetic polyneuropathy: Secondary | ICD-10-CM

## 2016-04-19 DIAGNOSIS — G2581 Restless legs syndrome: Secondary | ICD-10-CM | POA: Diagnosis not present

## 2016-04-19 MED ORDER — ROPINIROLE HCL 1 MG PO TABS: 1.0000 mg | ORAL_TABLET | Freq: Every day | ORAL | 4 refills | Status: DC

## 2016-04-19 MED ORDER — GABAPENTIN 100 MG PO CAPS: 300.0000 mg | ORAL_CAPSULE | Freq: Three times a day (TID) | ORAL | 4 refills | Status: DC

## 2016-04-19 NOTE — Progress Notes (Signed)
Chief Complaint  Patient presents with  . Restless Leg Syndrome    Feels his symptoms are well controlled.  He is taking Requip 1mg  at bedtime and gabapentin 300mg  as needed.  . Tremors    He is still having problems with tremors in his bilateral hands.      PATIENT: Micheal Guerra DOB: 07-20-1950  HISTORICAL  Micheal Guerra is a 65 yr RH male, accompanied by his wife, daughter, referred by his primary care physician Dr. Leighton Ruff for evaluation of worsening right hand tremor, right side neck pain,  He had a past medical history of cervical radiculopathy, questionable cervical myelopathy, was under the Care of [facility] neurosurgeon, had yearly checkup, most recent MRI was November 2014 at [facility], multilevel degenerative disc disease, most severe at C 5-6, C6-7, with moderate right foraminal stenosis mild to moderate canal stenosis. He also had a past medical history of lumbar decompression surgery, glucose intolerance, not on any medication treatment,  He worked at a maintenance job, which require welding, bending down, He noticed bilateral hands tremor  Since 2014, getting worse over the past few months, especially his right hand, is difficult for him to sign, holding his machine, getting difficult to 4 form his job,  He felt off ladder with lumbar fracture in November 2014,no surgeries needed, but ever since the accident, he complains of worsening right-sided neck pain, radiating pain to his right shoulder, right arm, subjective weakness of right hand, and right arm,  He has mild gait difficulty because of low back pain, also has intermittent bilateral anterior thigh area cold achy sensation, tends to get up pacing around if he sits for too long, will trying to go to sleep, was treated with clonazepam 1 mg every night for a while, which was helpful, but complains of sleepiness,  He has frequent nocturnal urination, urinate 7-8 times each night,  His father had tremor  in his old age, to the point of difficulty holding the cup of drink there was no head titubation.  UPDATE Aug 01 2014: He has tried inderal , did not help his tremor much, he feel tired with medications, he has retired in Jan 2016.  He also has loud snoring, excessive day time sleepiness, today's ESS score is 16, Fss score is 45  He continues to have bilateral hand shaking, neck pain, radiating pain to his left shoulder, low back pain, radiating pain to his right leg. Mild gait difficulity due to pain  UPDATE Apr 21 2015: He had sleep study in March 2016, confirmed mild obstructive sleep apnea, significant periodic leg movement disorder, he was put on Requip which does help him, he began to have symptoms around 7:30 pm, leg achiness, can not sit still, does responding to Requip 0 . 2 5 mg, but he often woke up in the middle of the night with recurrent leg moving, achiness sensation,  He is retired now, enjoying fishing trips, mild right hand tremor,  UPDATE October 30 2015: I reviewed laboratory results, ferritin was 285 in 2013, for a while his diabetes was out of control, there are A1c up to 13 point 8 in 2014, now his diabetes under much better control, average morning glucose level was around 130 to 140 range, he does has bilateral feet paresthesia, coldness sensation, he denies gait difficulty, he often feel tired during the day, mild low back pain, no significant gait difficulty  UPDATE Oct 9th 2017: He had a kidney stone about a month ago,  continue has bilateral hands tremor, he is taking Requip and gabapentin for his restless leg symptoms, symptoms are well controlled, he had worsening neck pain, low back pain recently, is followed up by [place]: Full 14 system review of systems performed and notable only for back pain, neck stiffness, restless leg  ALLERGIES: No Known Allergies  HOME MEDICATIONS: Current Outpatient Prescriptions on File Prior to Visit    Medication Sig Dispense Refill  . gabapentin (NEURONTIN) 100 MG capsule Take 3 capsules (300 mg total) by mouth 3 (three) times daily. 270 capsule 11  . metFORMIN (GLUCOPHAGE-XR) 500 MG 24 hr tablet 500 mg 2 (two) times daily.  6  . Multiple Vitamin (MULITIVITAMIN WITH MINERALS) TABS Take 1 tablet by mouth daily.    . ONE TOUCH ULTRA TEST test strip 2 (two) times daily. use for testing  12  . rOPINIRole (REQUIP) 1 MG tablet Take 1 tablet (1 mg total) by mouth at bedtime. 90 tablet 1   No current facility-administered medications on file prior to visit.     PAST MEDICAL HISTORY: Past Medical History:  Diagnosis Date  . DDD (degenerative disc disease), lumbar   . Diabetes mellitus without complication ([place])   . Fatty liver disease, nonalcoholic [id number]  . Hereditary essential tremor   . History of nephrolithiasis   . Hypogonadism, male 08/10/2011  . Kidney stones    multiple times  . Laryngeal nodule 07/2011  . Pinched nerve    ruptured disc, broke back  . Restless legs syndrome 08/09/2011    PAST SURGICAL HISTORY: Past Surgical History:  Procedure Laterality Date  . COLONOSCOPY  2007   normal per pt report  . CYSTOSCOPY W/ URETEROSCOPY W/ LITHOTRIPSY  02/2003   Left side (Dr. Jeffie Pollock)  . INGUINAL HERNIA REPAIR  2004  and  2005   bilateral  . LUMBAR SPINE SURGERY  04/2002   ruptured disk  . URETERAL STENT PLACEMENT  2011    FAMILY HISTORY: Family History  Problem Relation Age of Onset  . Cancer Mother     breast  . Heart disease Father     CAD/MI in his 1s  . Hypertension Father   . Diabetes Sister     SOCIAL HISTORY:  Social History   Social History  . Marital status: Married    Spouse name: [name]  . Number of children: 1  . Years of education: 54 th   Occupational History  .      Retired   Social History Main Topics  . Smoking status: Never Smoker  . Smokeless tobacco: Former Systems developer    Quit date: 06/12/2011  . Alcohol use No  . Drug use: No  .  Sexual activity: Not on file   Other Topics Concern  . Not on file   Social History Narrative   Patient lives at home with his wife [name])   Retired.   Education high school   Right handed.   Caffeine Coffee sometimes.     PHYSICAL EXAM   Vitals:   04/19/16 0837  BP: 135/82  Pulse: (!) 59  Weight: 232 lb (105.2 kg)  Height: 5\' 11"  (1.803 m)    Not recorded      Body mass index is 32.36 kg/m.   Generalized: In no acute distress  Neck: Supple, no carotid bruits   Cardiac: Regular rate rhythm  Pulmonary: Clear to auscultation bilaterally  Musculoskeletal: No deformity  Neurological examination  Mentation: Alert oriented to time,  place, history taking, and causual conversation  Cranial nerve II-XII: Pupils were equal round reactive to light. Extraocular movements were full.  Visual field were full on confrontational test. Bilateral fundi were sharp.  Facial sensation and strength were normal. Hearing was intact to finger rubbing bilaterally. Uvula tongue midline.  Head turning and shoulder shrug and were normal and symmetric.Tongue protrusion into cheek strength was normal.  Motor: he has mild right on shoulder abduction, external rotation, elbow flexion, wrist extension weakness  Sensory: Mildly length dependent decreased light touch pinprick at distal leg   Coordination: Normal finger to nose, heel-to-shin bilaterally there was no truncal ataxia  Gait: Mild antalgic, cautious, leaning forward  Romberg signs: Negative  Deep tendon reflexes: Brachioradialis 2/2, biceps 2/2, triceps 2/2, patellar 2/2, Achilles 2/2, plantar responses were flexor bilaterally.   DIAGNOSTIC DATA (LABS, IMAGING, TESTING) - I reviewed patient records, labs, notes, testing and imaging myself where available.  Lab Results  Component Value Date   WBC 8.2 05/20/2013   HGB 14.5 05/20/2013   HCT 42.1 05/20/2013   MCV 87.5 05/20/2013   PLT 172 05/20/2013      Component Value  Date/Time   NA 142 05/20/2013 1510   K 3.8 05/20/2013 1510   CL 103 05/20/2013 1510   CO2 28 05/20/2013 1510   GLUCOSE 172 (H) 05/20/2013 1510   BUN 16 05/20/2013 1510   CREATININE 1.20 05/20/2013 1510   CREATININE 0.91 08/09/2011 1609   CALCIUM 9.5 05/20/2013 1510   PROT 7.0 08/01/2012 1027   ALBUMIN 3.9 08/01/2012 1027   AST 38 (H) 08/01/2012 1027   ALT 64 (H) 08/01/2012 1027   ALKPHOS 97 08/01/2012 1027   BILITOT 1.3 (H) 08/01/2012 1027   GFRNONAA 63 (L) 05/20/2013 1510   GFRAA 73 (L) 05/20/2013 1510   Lab Results  Component Value Date   CHOL 130 08/01/2012   HDL 32.80 (L) 08/01/2012   LDLCALC 79 08/09/2011   LDLDIRECT 59.0 08/01/2012   TRIG 217.0 (H) 08/01/2012   CHOLHDL 4 08/01/2012   Lab Results  Component Value Date   HGBA1C 13.8 (H) 08/01/2012   Lab Results  Component Value Date   VITAMINB12 634 08/09/2011    ASSESSMENT AND PLAN  AYHAN HEMP is a 65 y.o. male complains of worsening bilateral hand tremor, right worse than left, there was also a history of right cervical radiculopathy, mild right arm, proximal and distal weakness, Restless leg syndrome  Try to decrease Requip 1 mg 2 half tablets every night to avoid augmentation  Keep gabapentin 300 mg 3 times a day , I have refilled her prescription Diabetic peripheral neuropathy   Advised patient continue tight control of his diabetes  Essential tremor  He has tried Inderal in the past, was not sure about the benefit, does not want to have extra medication right now   Right cervical radiculopathy, neck pain, low back pain,   Stable no significant neck pain no detectable arm weakness   is followed up by [facility]  Marcial Pacas, M.D. Ph.D.  [facility] [address], [place], [place] [address] [phone]

## 2016-08-05 DIAGNOSIS — M9953 Intervertebral disc stenosis of neural canal of lumbar region: Secondary | ICD-10-CM | POA: Diagnosis not present

## 2016-08-18 DIAGNOSIS — M4802 Spinal stenosis, cervical region: Secondary | ICD-10-CM | POA: Diagnosis not present

## 2016-08-30 DIAGNOSIS — M4692 Unspecified inflammatory spondylopathy, cervical region: Secondary | ICD-10-CM | POA: Diagnosis not present

## 2016-08-30 DIAGNOSIS — M9981 Other biomechanical lesions of cervical region: Secondary | ICD-10-CM | POA: Diagnosis not present

## 2016-08-30 DIAGNOSIS — M47892 Other spondylosis, cervical region: Secondary | ICD-10-CM | POA: Diagnosis not present

## 2016-08-30 DIAGNOSIS — M4802 Spinal stenosis, cervical region: Secondary | ICD-10-CM | POA: Diagnosis not present

## 2016-09-15 DIAGNOSIS — M4692 Unspecified inflammatory spondylopathy, cervical region: Secondary | ICD-10-CM | POA: Diagnosis not present

## 2016-09-15 DIAGNOSIS — M1288 Other specific arthropathies, not elsewhere classified, other specified site: Secondary | ICD-10-CM | POA: Diagnosis not present

## 2016-09-15 DIAGNOSIS — M9981 Other biomechanical lesions of cervical region: Secondary | ICD-10-CM | POA: Diagnosis not present

## 2016-09-15 DIAGNOSIS — M47812 Spondylosis without myelopathy or radiculopathy, cervical region: Secondary | ICD-10-CM | POA: Diagnosis not present

## 2016-09-15 DIAGNOSIS — M4802 Spinal stenosis, cervical region: Secondary | ICD-10-CM | POA: Diagnosis not present

## 2016-09-23 DIAGNOSIS — M47812 Spondylosis without myelopathy or radiculopathy, cervical region: Secondary | ICD-10-CM | POA: Diagnosis not present

## 2016-11-01 DIAGNOSIS — M47812 Spondylosis without myelopathy or radiculopathy, cervical region: Secondary | ICD-10-CM | POA: Diagnosis not present

## 2016-11-23 DIAGNOSIS — E119 Type 2 diabetes mellitus without complications: Secondary | ICD-10-CM | POA: Diagnosis not present

## 2016-11-30 DIAGNOSIS — Z85828 Personal history of other malignant neoplasm of skin: Secondary | ICD-10-CM | POA: Diagnosis not present

## 2016-11-30 DIAGNOSIS — L821 Other seborrheic keratosis: Secondary | ICD-10-CM | POA: Diagnosis not present

## 2016-11-30 DIAGNOSIS — D1801 Hemangioma of skin and subcutaneous tissue: Secondary | ICD-10-CM | POA: Diagnosis not present

## 2016-12-01 ENCOUNTER — Ambulatory Visit (INDEPENDENT_AMBULATORY_CARE_PROVIDER_SITE_OTHER): Payer: Medicare Other | Admitting: Podiatry

## 2016-12-01 ENCOUNTER — Encounter: Payer: Self-pay | Admitting: Podiatry

## 2016-12-01 VITALS — BP 149/72 | HR 71 | Resp 16 | Ht 71.0 in | Wt 230.0 lb

## 2016-12-01 DIAGNOSIS — L600 Ingrowing nail: Secondary | ICD-10-CM | POA: Diagnosis not present

## 2016-12-01 NOTE — Patient Instructions (Signed)

## 2016-12-01 NOTE — Progress Notes (Signed)
   Subjective:    Patient ID: Micheal Guerra, male    DOB: 13-Oct-1950, 66 y.o.   MRN: [id number]  HPI Chief Complaint  Patient presents with  . Nail Problem    Bilateral; great toes; Right foot-nail discoloration & thickened nail; pt stated, "Has had nail come on and off for years on the left foot big toe"      Review of Systems  All other systems reviewed and are negative.      Objective:   Physical Exam        Assessment & Plan:

## 2016-12-03 NOTE — Progress Notes (Signed)
Subjective:    Patient ID: Micheal Guerra, male   DOB: 65 y.o.   MRN: [id number]   HPI patient presents stating that he's lost his left hallux nail and it's not really bothering him but he had to severely thickened right hallux nail that becomes tender and makes it hard to wear shoe gear    Review of Systems  All other systems reviewed and are negative.       Objective:  Physical Exam  Cardiovascular: Intact distal pulses.   Musculoskeletal: Normal range of motion.  Neurological: He is alert.  Skin: Skin is warm.  Nursing note and vitals reviewed.  neurovascular status intact muscle strength is adequate with patient found to have significant thickness and dystrophic changes to the right hallux nail with moderate pain and left hallux nail that's been lost with new nail regrowing and difficult to evaluate how we'll grow. Patient's found have good digital perfusion and is well oriented 3     Assessment:    Damaged right hallux nail with thickness incurvation and pain with left hallux showing well-healed nail site where nail was lost     Plan:    H&P conditions reviewed. Due to long-standing nature of this deformity worsening of condition inability to cut and pain I've recommended nail removal right and explained procedure and risk. Patient wants surgery and today I infiltrated 60 mg Xylocaine Marcaine mixture and remove the hallux nail right exposed matrix and applied phenol 5 applications 30 seconds followed by alcohol lavaged sterile dressing. Gave instructions on soaks reappoint and also it's possible if the left grows abnormally it may need to be removed some day

## 2016-12-23 DIAGNOSIS — M47812 Spondylosis without myelopathy or radiculopathy, cervical region: Secondary | ICD-10-CM | POA: Diagnosis not present

## 2017-01-24 DIAGNOSIS — Z7984 Long term (current) use of oral hypoglycemic drugs: Secondary | ICD-10-CM | POA: Diagnosis not present

## 2017-01-24 DIAGNOSIS — Z79899 Other long term (current) drug therapy: Secondary | ICD-10-CM | POA: Diagnosis not present

## 2017-01-24 DIAGNOSIS — M542 Cervicalgia: Secondary | ICD-10-CM | POA: Diagnosis not present

## 2017-01-24 DIAGNOSIS — M48061 Spinal stenosis, lumbar region without neurogenic claudication: Secondary | ICD-10-CM | POA: Diagnosis not present

## 2017-01-24 DIAGNOSIS — Z9889 Other specified postprocedural states: Secondary | ICD-10-CM | POA: Diagnosis not present

## 2017-01-24 DIAGNOSIS — E119 Type 2 diabetes mellitus without complications: Secondary | ICD-10-CM | POA: Diagnosis not present

## 2017-02-28 DIAGNOSIS — M5416 Radiculopathy, lumbar region: Secondary | ICD-10-CM | POA: Diagnosis not present

## 2017-02-28 DIAGNOSIS — M48061 Spinal stenosis, lumbar region without neurogenic claudication: Secondary | ICD-10-CM | POA: Diagnosis not present

## 2017-03-27 IMAGING — CR DG ABDOMEN 1V
2 series · 2 of 2 positions shown · non-contrast
Comparison: CT 05/20/2013

CLINICAL DATA: Lithotripsy today and right ureteral stone. No pain.
Known left renal stone.

EXAM:
ABDOMEN - 1 VIEW

[t abdomen supine (1 of 2)]
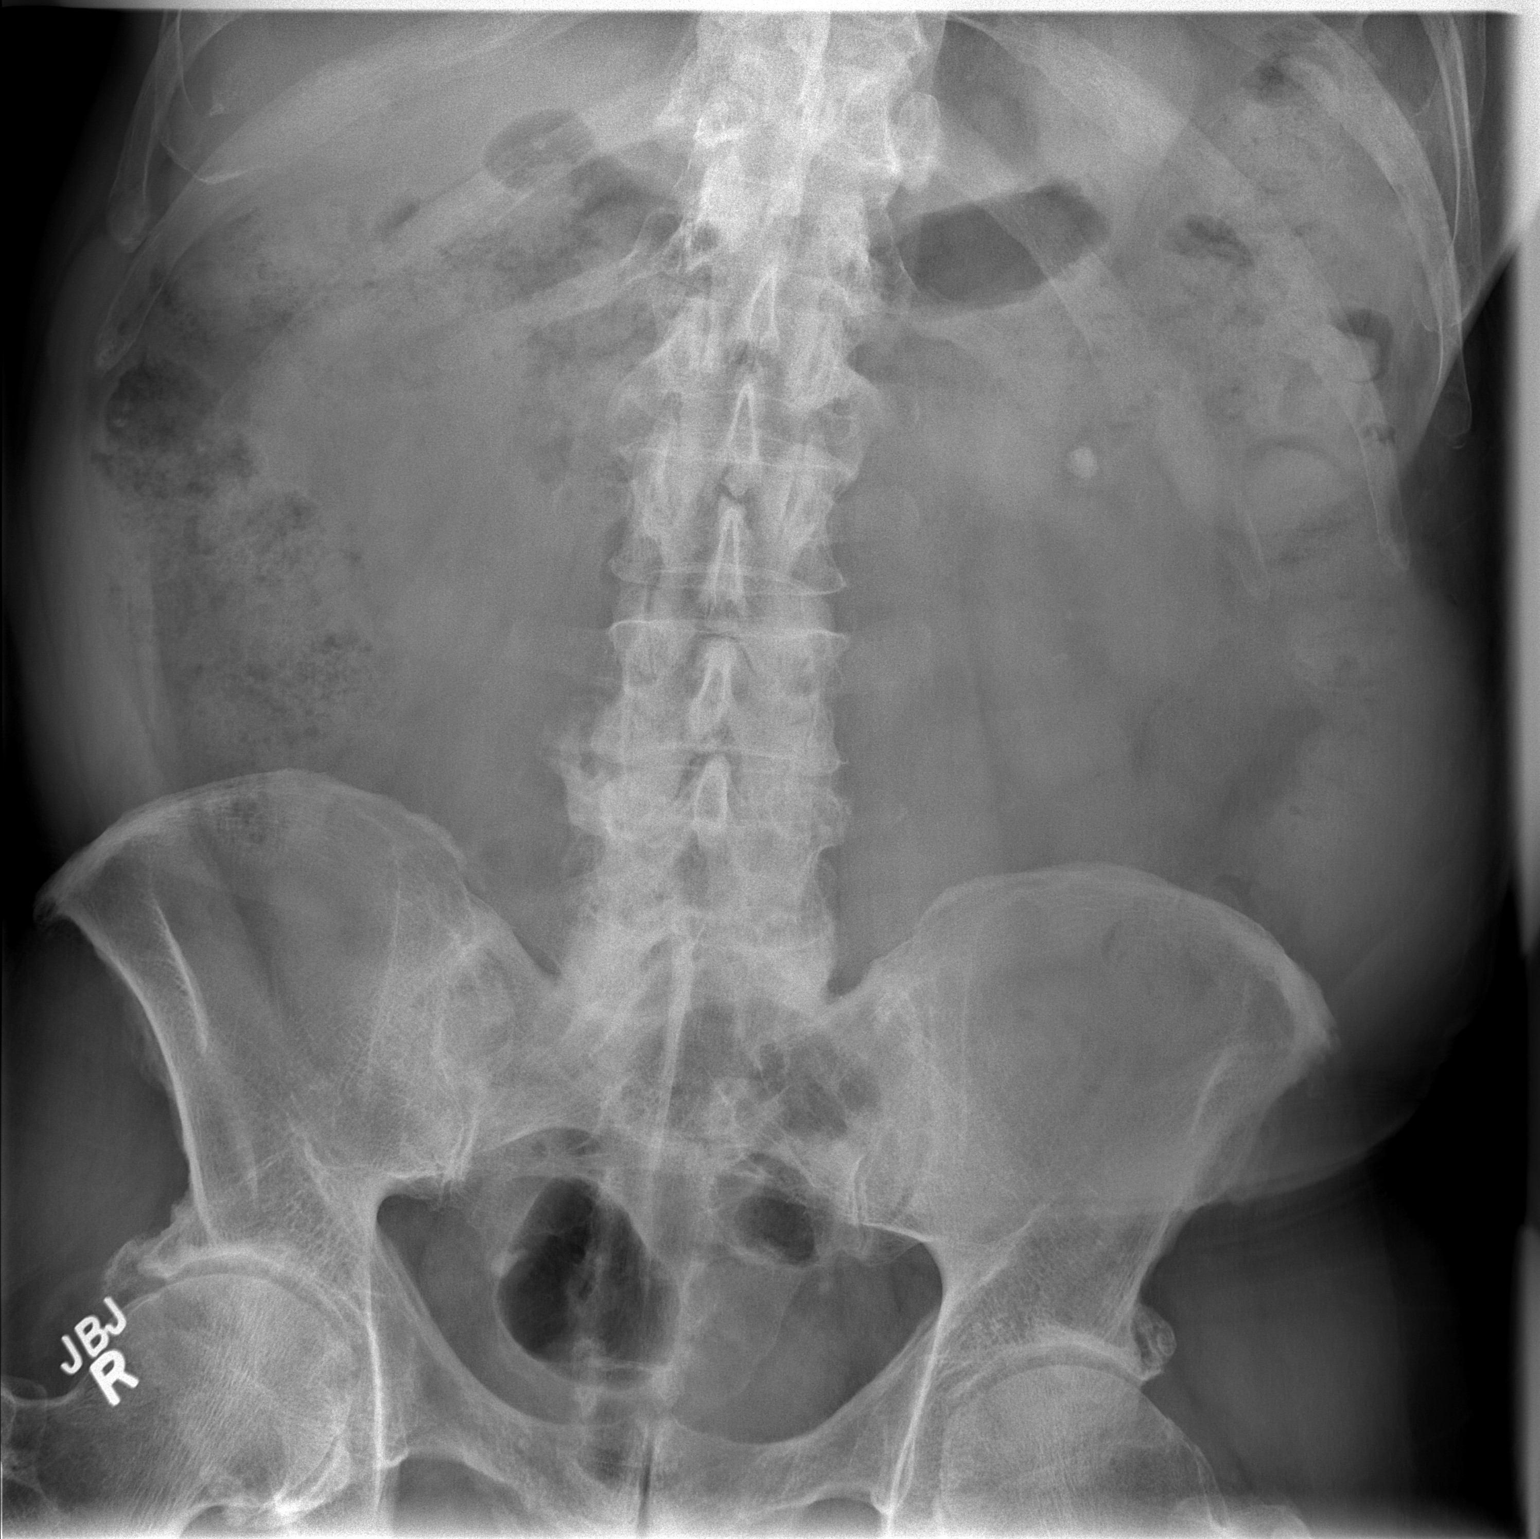

[t abdomen supine (2 of 2)]
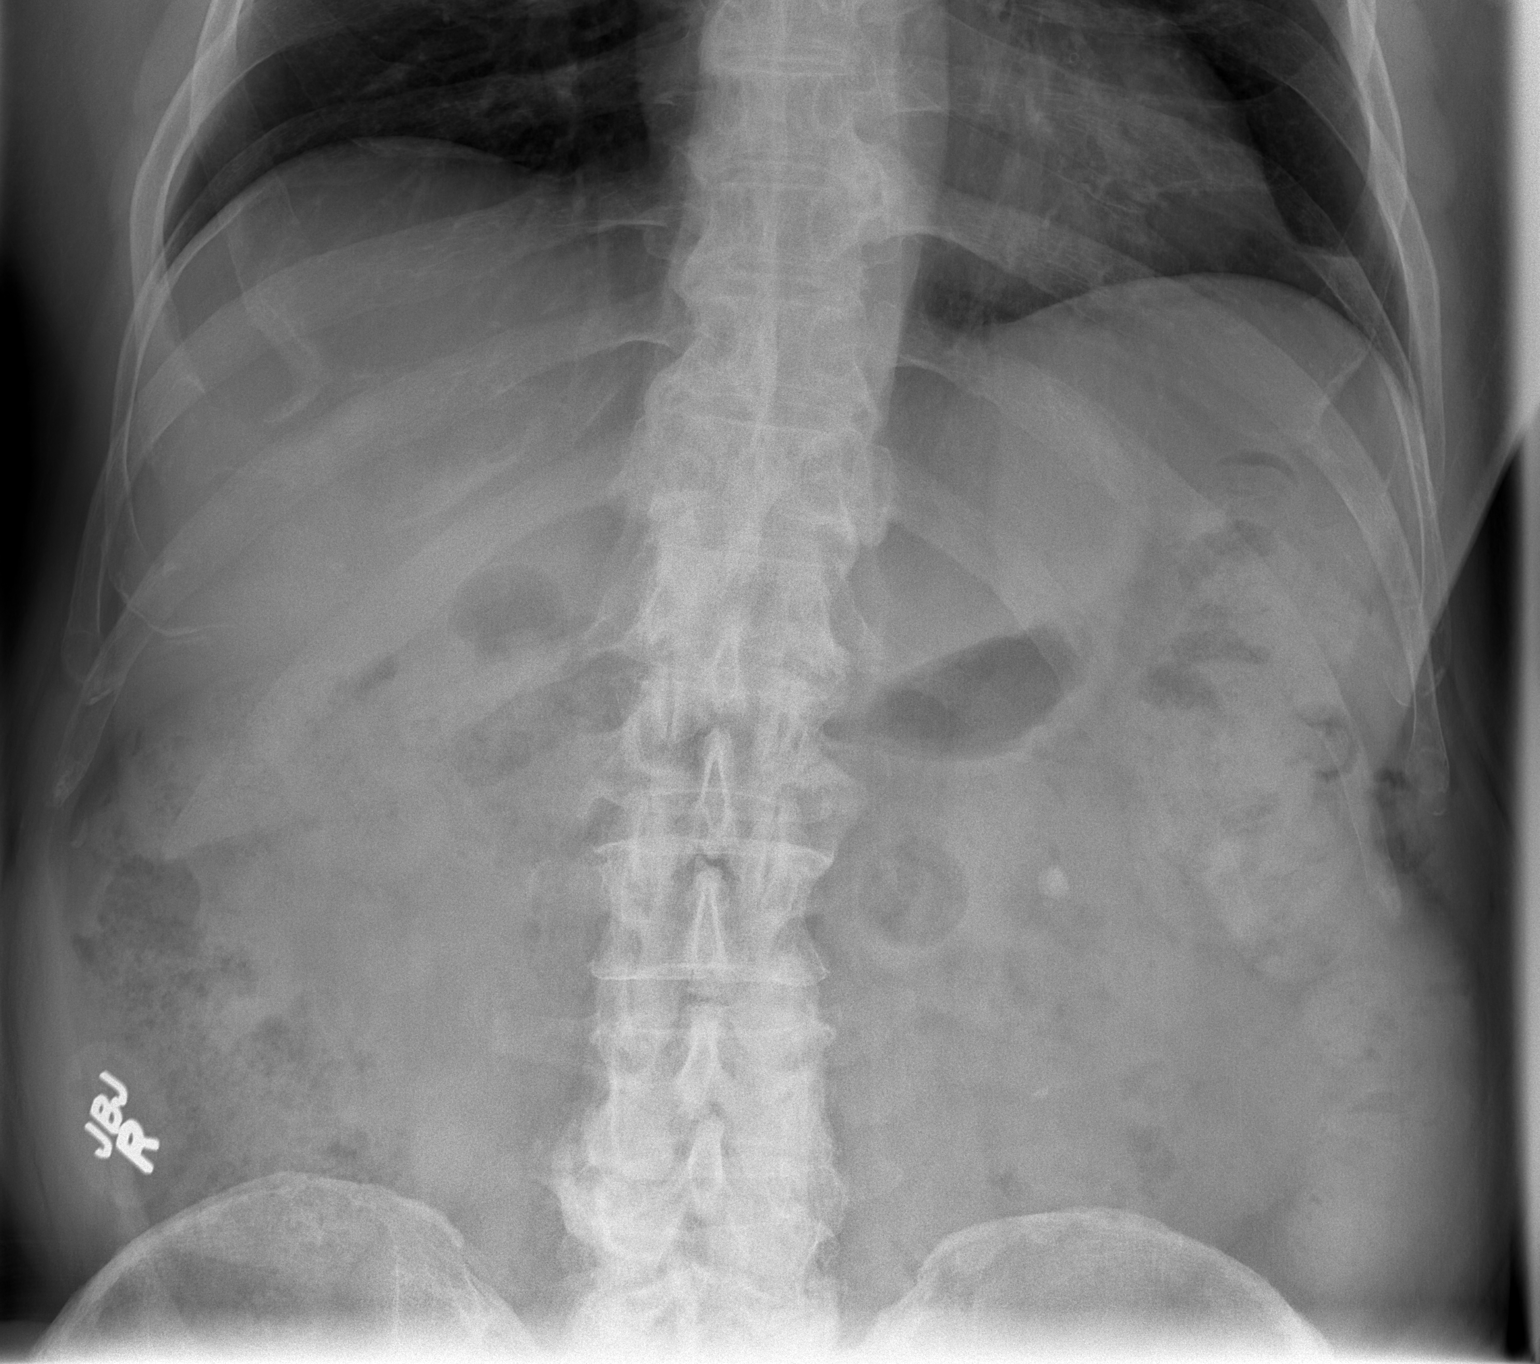

[2 of 2 positions shown; findings below may reference images not displayed]

FINDINGS: Bowel gas pattern is nonobstructive. There is mild fecal retention
throughout the colon. There is an 8 mm stone over the mid upper pole
left kidney. Couple smaller calcifications projected over the lower
pole left kidney. Sub mm calcification over the right pelvis which
may be within the distal right ureter. There are moderate
degenerative changes of the spine and hips, right greater than left.
IMPRESSION: Nonobstructive bowel gas pattern with mild fecal retention
throughout the colon.

7 mm calcification over the right pelvis which may be ureteral in
origin. 8 mm left renal stone.

## 2017-03-28 DIAGNOSIS — R252 Cramp and spasm: Secondary | ICD-10-CM | POA: Diagnosis not present

## 2017-03-28 DIAGNOSIS — M5416 Radiculopathy, lumbar region: Secondary | ICD-10-CM | POA: Diagnosis not present

## 2017-03-28 DIAGNOSIS — E119 Type 2 diabetes mellitus without complications: Secondary | ICD-10-CM | POA: Diagnosis not present

## 2017-03-28 DIAGNOSIS — M545 Low back pain: Secondary | ICD-10-CM | POA: Diagnosis not present

## 2017-05-02 ENCOUNTER — Telehealth: Payer: Self-pay | Admitting: *Deleted

## 2017-05-02 MED ORDER — ROPINIROLE HCL 1 MG PO TABS: 1.0000 mg | ORAL_TABLET | Freq: Every day | ORAL | 0 refills | Status: DC

## 2017-05-02 NOTE — Telephone Encounter (Signed)
Received refill request from pharmacy for Requip.  Patient not seen in one year.  Provided 90 day supply with no additional refills.  Left a message on his voicemail requesting him to call our office for appt prior to his next refill date.  He can be seen by MD or NP.

## 2017-05-09 ENCOUNTER — Ambulatory Visit: Payer: Medicare Other | Admitting: Adult Health

## 2017-07-24 ENCOUNTER — Telehealth: Payer: Self-pay | Admitting: *Deleted

## 2017-07-24 NOTE — Telephone Encounter (Signed)
Contact patient to reschedule yearly follow up appt.  Needs to be seen in January 2019.

## 2017-07-25 NOTE — Telephone Encounter (Signed)
Spoke to patient - his appt has been rescheduled with Megan at [facility] on 07/04/18.  He is aware to arrived at 12:30.

## 2017-07-28 DIAGNOSIS — E119 Type 2 diabetes mellitus without complications: Secondary | ICD-10-CM | POA: Diagnosis not present

## 2017-08-02 ENCOUNTER — Ambulatory Visit: Payer: Medicare Other | Admitting: Neurology

## 2017-08-03 ENCOUNTER — Other Ambulatory Visit: Payer: Self-pay | Admitting: Neurology

## 2017-08-04 ENCOUNTER — Encounter: Payer: Self-pay | Admitting: Adult Health

## 2017-08-04 ENCOUNTER — Ambulatory Visit: Payer: Medicare HMO | Admitting: Adult Health

## 2017-08-04 VITALS — BP 159/98 | HR 96 | Ht 71.0 in | Wt 238.6 lb

## 2017-08-04 DIAGNOSIS — G2581 Restless legs syndrome: Secondary | ICD-10-CM

## 2017-08-04 DIAGNOSIS — E1142 Type 2 diabetes mellitus with diabetic polyneuropathy: Secondary | ICD-10-CM

## 2017-08-04 MED ORDER — ROPINIROLE HCL 1 MG PO TABS
ORAL_TABLET | ORAL | 1 refills | Status: DC
Start: 2017-08-04 — End: 2017-12-15

## 2017-08-04 NOTE — Progress Notes (Signed)
I have reviewed and agreed above plan. 

## 2017-08-04 NOTE — Progress Notes (Signed)
PATIENT: Micheal Guerra DOB: 04/16/1951  REASON FOR VISIT: follow up HISTORY FROM: patient  HISTORY OF PRESENT ILLNESS: HISTORY Micheal Guerra is a 67 yr RH male, accompanied by his wife, daughter, referred by his primary care physician Dr. Leighton Ruff for evaluation of worsening right hand tremor, right side neck pain,  He had a past medical history of cervical radiculopathy, questionable cervical myelopathy, was under the Care of [facility] neurosurgeon, had yearly checkup, most recent MRI was November 2014 at [facility], multilevel degenerative disc disease, most severe at C 5-6, C6-7, with moderate right foraminal stenosis mild to moderate canal stenosis. He also had a past medical history of lumbar decompression surgery, glucose intolerance, not on any medication treatment,  He worked at a maintenance job, which require welding, bending down, He noticed bilateral hands tremor  Since 2014, getting worse over the past few months, especially his right hand, is difficult for him to sign, holding his machine, getting difficult to 4 form his job,  He felt off ladder with lumbar fracture in November 2014,no surgeries needed, but ever since the accident, he complains of worsening right-sided neck pain, radiating pain to his right shoulder, right arm, subjective weakness of right hand, and right arm,  He has mild gait difficulty because of low back pain, also has intermittent bilateral anterior thigh area cold achy sensation, tends to get up pacing around if he sits for too long, will trying to go to sleep, was treated with clonazepam 1 mg every night for a while, which was helpful, but complains of sleepiness,  He has frequent nocturnal urination, urinate 7-8 times each night,  His father had tremor in his old age, to the point of difficulty holding the cup of drink there was no head titubation.  UPDATE Aug 01 2014: He has tried inderal , did not help his tremor much, he  feel tired with medications, he has retired in Jan 2016.  He also has loud snoring, excessive day time sleepiness, today's ESS score is 16, Fss score is 45  He continues to have bilateral hand shaking, neck pain, radiating pain to his left shoulder, low back pain, radiating pain to his right leg. Mild gait difficulity due to pain  UPDATE Apr 21 2015: He had sleep study in March 2016, confirmed mild obstructive sleep apnea, significant periodic leg movement disorder, he was put on Requip which does help him, he began to have symptoms around 7:30 pm, leg achiness, can not sit still, does responding to Requip 0 . 2 5 mg, but he often woke up in the middle of the night with recurrent leg moving, achiness sensation,  He is retired now, enjoying fishing trips, mild right hand tremor,  UPDATE October 30 2015: I reviewed laboratory results, ferritin was 285 in 2013, for a while his diabetes was out of control, there are A1c up to 13 point 8 in 2014, now his diabetes under much better control, average morning glucose level was around 130 to 140 range, he does has bilateral feet paresthesia, coldness sensation, he denies gait difficulty, he often feel tired during the day, mild low back pain, no significant gait difficulty  UPDATE Oct 9th 2017: He had a kidney stone about a month ago, continue has bilateral hands tremor, he is taking Requip and gabapentin for his restless leg symptoms, symptoms are well controlled, he had worsening neck pain, low back pain recently, is followed up by [facility]  Today 08/04/17  [name]  is a 67 year old male with a history of restless leg syndrome and diabetic peripheral neuropathy.  He returns today for follow-up.  He reports overall he is doing well.  He reports that Requip does tend to control his symptoms.  He only has 2-3 nights a week that he has breakthrough symptoms that prohibits him from falling asleep quickly.  He reports that he does have some  tingling in the bottom of the feet however this does not offer him any significant discomfort.  He denies any changes with his gait or balance.  He returns today for an evaluation.   REVIEW OF SYSTEMS: Out of a complete 14 system review of symptoms, the patient complains only of the following symptoms, and all other reviewed systems are negative.  ALLERGIES: No Known Allergies  HOME MEDICATIONS: Outpatient Medications Prior to Visit  Medication Sig Dispense Refill  . DULoxetine (CYMBALTA) 60 MG capsule Take by mouth.    Marland Kitchen glimepiride (AMARYL) 2 MG tablet Take 2 mg by mouth daily with breakfast.    . metFORMIN (GLUCOPHAGE-XR) 500 MG 24 hr tablet 500 mg 2 (two) times daily.  6  . Multiple Vitamin (MULITIVITAMIN WITH MINERALS) TABS Take 1 tablet by mouth daily.    . ONE TOUCH ULTRA TEST test strip 2 (two) times daily. use for testing  12  . rOPINIRole (REQUIP) 1 MG tablet Take 1 tablet (1 mg total) by mouth at bedtime. 90 tablet 0   No facility-administered medications prior to visit.     PAST MEDICAL HISTORY: Past Medical History:  Diagnosis Date  . DDD (degenerative disc disease), lumbar   . Diabetes mellitus without complication ([place])   . Fatty liver disease, nonalcoholic 0/97/3532  . Hereditary essential tremor   . History of nephrolithiasis   . Hypogonadism, male 08/10/2011  . Kidney stones    multiple times  . Laryngeal nodule 07/2011  . Pinched nerve    ruptured disc, broke back  . Restless legs syndrome 08/09/2011    PAST SURGICAL HISTORY: Past Surgical History:  Procedure Laterality Date  . COLONOSCOPY  2007   normal per pt report  . CYSTOSCOPY W/ URETEROSCOPY W/ LITHOTRIPSY  02/2003   Left side (Dr. Jeffie Pollock)  . INGUINAL HERNIA REPAIR  2004  and  2005   bilateral  . LUMBAR SPINE SURGERY  04/2002   ruptured disk  . URETERAL STENT PLACEMENT  2011    FAMILY HISTORY: Family History  Problem Relation Age of Onset  . Cancer Mother        breast  . Heart disease  Father        CAD/MI in his 42s  . Hypertension Father   . Diabetes Sister     SOCIAL HISTORY: Social History   Socioeconomic History  . Marital status: Married    Spouse name: [name]  . Number of children: 1  . Years of education: 101 th  . Highest education level: Not on file  Social Needs  . Financial resource strain: Not on file  . Food insecurity - worry: Not on file  . Food insecurity - inability: Not on file  . Transportation needs - medical: Not on file  . Transportation needs - non-medical: Not on file  Occupational History    Comment: Retired  Tobacco Use  . Smoking status: Never Smoker  . Smokeless tobacco: Former Network engineer and Sexual Activity  . Alcohol use: No    Alcohol/week: 0.0 oz  . Drug use: No  . Sexual  activity: Not on file  Other Topics Concern  . Not on file  Social History Narrative   Patient lives at home with his wife [name])   Retired.   Education high school   Right handed.   Caffeine Coffee sometimes.      PHYSICAL EXAM  Vitals:   08/04/17 1308  BP: (!) 159/98  Pulse: 96  Weight: 238 lb 9.6 oz (108.2 kg)  Height: 5\' 11"  (1.803 m)   Body mass index is 33.28 kg/m.  Generalized: Well developed, in no acute distress   Neurological examination  Mentation: Alert oriented to time, place, history taking. Follows all commands speech and language fluent Cranial nerve II-XII: Pupils were equal round reactive to light. Extraocular movements were full, visual field were full on confrontational test. Facial sensation and strength were normal. Uvula tongue midline. Head turning and shoulder shrug  were normal and symmetric. Motor: The motor testing reveals 5 over 5 strength of all 4 extremities. Good symmetric motor tone is noted throughout.  Sensory: Sensory testing is intact to soft touch on all 4 extremities. No evidence of extinction is noted.  Coordination: Cerebellar testing reveals good finger-nose-finger and heel-to-shin  bilaterally.  Gait and station: Gait is normal. Reflexes: Deep tendon reflexes are symmetric and normal bilaterally.   DIAGNOSTIC DATA (LABS, IMAGING, TESTING) - I reviewed patient records, labs, notes, testing and imaging myself where available.  Lab Results  Component Value Date   WBC 8.2 05/20/2013   HGB 14.5 05/20/2013   HCT 42.1 05/20/2013   MCV 87.5 05/20/2013   PLT 172 05/20/2013      Component Value Date/Time   NA 142 05/20/2013 1510   K 3.8 05/20/2013 1510   CL 103 05/20/2013 1510   CO2 28 05/20/2013 1510   GLUCOSE 172 (H) 05/20/2013 1510   BUN 16 05/20/2013 1510   CREATININE 1.20 05/20/2013 1510   CREATININE 0.91 08/09/2011 1609   CALCIUM 9.5 05/20/2013 1510   PROT 7.0 08/01/2012 1027   ALBUMIN 3.9 08/01/2012 1027   AST 38 (H) 08/01/2012 1027   ALT 64 (H) 08/01/2012 1027   ALKPHOS 97 08/01/2012 1027   BILITOT 1.3 (H) 08/01/2012 1027   GFRNONAA 63 (L) 05/20/2013 1510   GFRAA 73 (L) 05/20/2013 1510   Lab Results  Component Value Date   CHOL 130 08/01/2012   HDL 32.80 (L) 08/01/2012   LDLCALC 79 08/09/2011   LDLDIRECT 59.0 08/01/2012   TRIG 217.0 (H) 08/01/2012   CHOLHDL 4 08/01/2012   Lab Results  Component Value Date   HGBA1C 13.8 (H) 08/01/2012   Lab Results  Component Value Date   VITAMINB12 634 08/09/2011   Lab Results  Component Value Date   TSH 1.330 05/21/2014      ASSESSMENT AND PLAN 67 y.o. year old male  has a past medical history of DDD (degenerative disc disease), lumbar, Diabetes mellitus without complication ([place]), Fatty liver disease, nonalcoholic (2/37/6283), Hereditary essential tremor, History of nephrolithiasis, Hypogonadism, male (08/10/2011), Kidney stones, Laryngeal nodule (07/2011), Pinched nerve, and Restless legs syndrome (08/09/2011). here with:  1.  Restless leg syndrome 2.  Diabetic peripheral neuropathy  Overall the patient is doing well.  He will continue on Requip 1 mg at bedtime.  I did advise that he could take an  additional half a tablet at bedtime if needed for breakthrough symptoms.  The patient does not have any significant discomfort related to his neuropathy.  He is advised that if his symptoms worsen or he develops  new symptoms he should let us know.  He will follow-up in 1 year or sooner if needed.    Ward Givens, MSN, NP-C 08/04/2017, 1:30 PM [facility] [address] [place], [place] [address] [phone]

## 2017-08-04 NOTE — Patient Instructions (Signed)
Your Plan:  Continue 1 mg of requip at bedtime. Can take 1/2 tablet at night for breakthrough  If your symptoms worsen or you develop new symptoms please let us know.   Thank you for coming to see [place] at [facility]. I hope we have been able to provide you high quality care today.  You may receive a patient satisfaction survey over the next few weeks. We would appreciate your feedback and comments so that we may continue to improve ourselves and the health of our patients.

## 2017-09-23 DIAGNOSIS — R69 Illness, unspecified: Secondary | ICD-10-CM | POA: Diagnosis not present

## 2017-10-04 ENCOUNTER — Encounter: Payer: Self-pay | Admitting: Adult Health

## 2017-10-08 ENCOUNTER — Other Ambulatory Visit: Payer: Self-pay | Admitting: Adult Health

## 2017-11-10 DIAGNOSIS — R69 Illness, unspecified: Secondary | ICD-10-CM | POA: Diagnosis not present

## 2017-11-30 DIAGNOSIS — Z85828 Personal history of other malignant neoplasm of skin: Secondary | ICD-10-CM | POA: Diagnosis not present

## 2017-11-30 DIAGNOSIS — D3617 Benign neoplasm of peripheral nerves and autonomic nervous system of trunk, unspecified: Secondary | ICD-10-CM | POA: Diagnosis not present

## 2017-11-30 DIAGNOSIS — D1801 Hemangioma of skin and subcutaneous tissue: Secondary | ICD-10-CM | POA: Diagnosis not present

## 2017-11-30 DIAGNOSIS — L570 Actinic keratosis: Secondary | ICD-10-CM | POA: Diagnosis not present

## 2017-11-30 DIAGNOSIS — L821 Other seborrheic keratosis: Secondary | ICD-10-CM | POA: Diagnosis not present

## 2017-12-15 ENCOUNTER — Other Ambulatory Visit: Payer: Self-pay | Admitting: Adult Health

## 2018-01-02 DIAGNOSIS — R69 Illness, unspecified: Secondary | ICD-10-CM | POA: Diagnosis not present

## 2018-01-25 DIAGNOSIS — E119 Type 2 diabetes mellitus without complications: Secondary | ICD-10-CM | POA: Diagnosis not present

## 2018-02-07 ENCOUNTER — Ambulatory Visit: Payer: Medicare HMO | Admitting: Adult Health

## 2018-02-20 DIAGNOSIS — R69 Illness, unspecified: Secondary | ICD-10-CM | POA: Diagnosis not present

## 2018-04-10 DIAGNOSIS — R69 Illness, unspecified: Secondary | ICD-10-CM | POA: Diagnosis not present

## 2018-04-13 DIAGNOSIS — R69 Illness, unspecified: Secondary | ICD-10-CM | POA: Diagnosis not present

## 2018-05-23 DIAGNOSIS — E119 Type 2 diabetes mellitus without complications: Secondary | ICD-10-CM | POA: Diagnosis not present

## 2018-06-01 DIAGNOSIS — R809 Proteinuria, unspecified: Secondary | ICD-10-CM | POA: Diagnosis not present

## 2018-06-01 DIAGNOSIS — E119 Type 2 diabetes mellitus without complications: Secondary | ICD-10-CM | POA: Diagnosis not present

## 2018-06-07 DIAGNOSIS — R69 Illness, unspecified: Secondary | ICD-10-CM | POA: Diagnosis not present

## 2018-06-18 ENCOUNTER — Other Ambulatory Visit: Payer: Self-pay | Admitting: Neurology

## 2018-07-24 DIAGNOSIS — D485 Neoplasm of uncertain behavior of skin: Secondary | ICD-10-CM | POA: Diagnosis not present

## 2018-07-24 DIAGNOSIS — B079 Viral wart, unspecified: Secondary | ICD-10-CM | POA: Diagnosis not present

## 2018-07-24 DIAGNOSIS — L738 Other specified follicular disorders: Secondary | ICD-10-CM | POA: Diagnosis not present

## 2018-07-24 DIAGNOSIS — D2239 Melanocytic nevi of other parts of face: Secondary | ICD-10-CM | POA: Diagnosis not present

## 2018-07-27 DIAGNOSIS — R69 Illness, unspecified: Secondary | ICD-10-CM | POA: Diagnosis not present

## 2018-08-10 ENCOUNTER — Ambulatory Visit: Payer: Medicare HMO | Admitting: Neurology

## 2018-09-28 DIAGNOSIS — Z85828 Personal history of other malignant neoplasm of skin: Secondary | ICD-10-CM | POA: Diagnosis not present

## 2018-09-28 DIAGNOSIS — L738 Other specified follicular disorders: Secondary | ICD-10-CM | POA: Diagnosis not present

## 2018-09-28 DIAGNOSIS — A6920 Lyme disease, unspecified: Secondary | ICD-10-CM | POA: Diagnosis not present

## 2018-09-28 DIAGNOSIS — D485 Neoplasm of uncertain behavior of skin: Secondary | ICD-10-CM | POA: Diagnosis not present

## 2018-10-10 DIAGNOSIS — R69 Illness, unspecified: Secondary | ICD-10-CM | POA: Diagnosis not present

## 2018-11-14 ENCOUNTER — Telehealth: Payer: Self-pay

## 2018-11-14 NOTE — Telephone Encounter (Signed)
I called and left a detailed message for patient making him aware that due to the current COVID 19 pandemic, our office is severely limiting non-urgent visits to the office, in order to reduce exposure of the virus to our patients and staff and I offered to move his appt up and/or convert to a doxy.me visit. I advised him that if he didn't have access to a device with a camera that we could do a telephone visit as well.

## 2018-11-21 ENCOUNTER — Ambulatory Visit (INDEPENDENT_AMBULATORY_CARE_PROVIDER_SITE_OTHER): Payer: Medicare HMO | Admitting: Neurology

## 2018-11-21 ENCOUNTER — Other Ambulatory Visit: Payer: Self-pay

## 2018-11-21 DIAGNOSIS — E1142 Type 2 diabetes mellitus with diabetic polyneuropathy: Secondary | ICD-10-CM | POA: Diagnosis not present

## 2018-11-21 DIAGNOSIS — G2581 Restless legs syndrome: Secondary | ICD-10-CM

## 2018-11-21 MED ORDER — DULOXETINE HCL 60 MG PO CPEP: 60.0000 mg | ORAL_CAPSULE | Freq: Every day | ORAL | 4 refills | Status: AC

## 2018-11-21 MED ORDER — ROPINIROLE HCL 1 MG PO TABS: 1.0000 mg | ORAL_TABLET | Freq: Every day | ORAL | 4 refills | Status: DC

## 2018-11-21 NOTE — Progress Notes (Signed)
PATIENT: Micheal Guerra DOB: [birth year]  REASON FOR VISIT: follow up Micheal FROM: patient  Micheal OF PRESENT ILLNESS: Micheal Guerra is a 19 yr RH male, accompanied by his wife, daughter, referred by his primary care physician Dr. Leighton Ruff for evaluation of worsening right hand tremor, right side neck pain,  He had a past medical Micheal of cervical radiculopathy, questionable cervical myelopathy, was under the Care of [facility] neurosurgeon, had yearly checkup, most recent MRI was November 2014 at [facility], multilevel degenerative disc disease, most severe at C 5-6, C6-7, with moderate right foraminal stenosis mild to moderate canal stenosis. He also had a past medical Micheal of lumbar decompression surgery, glucose intolerance, not on any medication treatment,  He worked at a maintenance job, which require welding, bending down, He noticed bilateral hands tremor  Since 2014, getting worse over the past few months, especially his right hand, is difficult for him to sign, holding his machine, getting difficult to 4 form his job,  He felt off ladder with lumbar fracture in November 2014,no surgeries needed, but ever since the accident, he complains of worsening right-sided neck pain, radiating pain to his right shoulder, right arm, subjective weakness of right hand, and right arm,  He has mild gait difficulty because of low back pain, also has intermittent bilateral anterior thigh area cold achy sensation, tends to get up pacing around if he sits for too long, will trying to go to sleep, was treated with clonazepam 1 mg every night for a while, which was helpful, but complains of sleepiness,  He has frequent nocturnal urination, urinate 7-8 times each night,  His father had tremor in his old age, to the point of difficulty holding the cup of drink there was no head titubation.  UPDATE Aug 01 2014: He has tried inderal , did not help his tremor much, he  feel tired with medications, he has retired in Jan 2016.  He also has loud snoring, excessive day time sleepiness, today's ESS score is 16, Fss score is 45  He continues to have bilateral hand shaking, neck pain, radiating pain to his left shoulder, low back pain, radiating pain to his right leg. Mild gait difficulity due to pain  UPDATE Apr 21 2015: He had sleep study in March 2016, confirmed mild obstructive sleep apnea, significant periodic leg movement disorder, he was put on Requip which does help him, he began to have symptoms around 7:30 pm, leg achiness, can not sit still, does responding to Requip 0 . 2 5 mg, but he often woke up in the middle of the night with recurrent leg moving, achiness sensation,  He is retired now, enjoying fishing trips, mild right hand tremor,  UPDATE October 30 2015: I reviewed laboratory results, ferritin was 285 in 2013, for a while his diabetes was out of control, there are A1c up to 13 point 8 in 2014, now his diabetes under much better control, average morning glucose level was around 130 to 140 range, he does has bilateral feet paresthesia, coldness sensation, he denies gait difficulty, he often feel tired during the day, mild low back pain, no significant gait difficulty  UPDATE Oct 9th 2017: He had a kidney stone about a month ago, continue has bilateral hands tremor, he is taking Requip and gabapentin for his restless leg symptoms, symptoms are well controlled, he had worsening neck pain, low back pain recently, is followed up by [facility]  Today 11/21/18  Micheal Guerra  is a 68 year old male with a Micheal of restless leg syndrome and diabetic peripheral neuropathy.  He returns today for follow-up.  He reports overall he is doing well.  He reports that Requip does tend to control his symptoms.  He only has 2-3 nights a week that he has breakthrough symptoms that prohibits him from falling asleep quickly.  He reports that he does have some  tingling in the bottom of the feet however this does not offer him any significant discomfort.  He denies any changes with his gait or balance.  He returns today for an evaluation.   Virtual Visit via telephone  I connected with Micheal Guerra on 11/21/18 at  by telephone and verified that I am speaking with the correct person using two identifiers.   I discussed the limitations, risks, security and privacy concerns of performing an evaluation and management service by telephone and the availability of in person appointments. I also discussed with the patient that there may be a patient responsible charge related to this service. The patient expressed understanding and agreed to proceed.  Micheal of Present Illness: Her diabetes under much better control, she has lost 25 pounds, is on low calorie diet, she continue complains of bilateral lower extremity paresthesia, sometimes painful after bearing weight,  he denies gait abnormality, no bilateral upper extremity paresthesia.  He does have restless leg symptoms, Requip 1 mg every night was helpful  Observations/Objective: I have reviewed problem lists, medications, allergies.  Assessment and Plan: Diabetic peripheral neuropathy Restless leg syndrome  Continue Cymbalta 60 mg daily  Refill Requip 1 mg every night  Follow Up Instructions:   As needed.   I discussed the assessment and treatment plan with the patient. The patient was provided an opportunity to ask questions and all were answered. The patient agreed with the plan and demonstrated an understanding of the instructions.   The patient was advised to call back or seek an in-person evaluation if the symptoms worsen or if the condition fails to improve as anticipated.  I provided 20 minutes of non-face-to-face time during this encounter.   Marcial Pacas, MD

## 2018-11-24 ENCOUNTER — Encounter: Payer: Self-pay | Admitting: Neurology

## 2018-11-30 DIAGNOSIS — E119 Type 2 diabetes mellitus without complications: Secondary | ICD-10-CM | POA: Diagnosis not present

## 2019-05-02 DIAGNOSIS — H52221 Regular astigmatism, right eye: Secondary | ICD-10-CM | POA: Diagnosis not present

## 2019-05-02 DIAGNOSIS — H2513 Age-related nuclear cataract, bilateral: Secondary | ICD-10-CM | POA: Diagnosis not present

## 2019-05-02 DIAGNOSIS — H5203 Hypermetropia, bilateral: Secondary | ICD-10-CM | POA: Diagnosis not present

## 2019-05-02 DIAGNOSIS — E119 Type 2 diabetes mellitus without complications: Secondary | ICD-10-CM | POA: Diagnosis not present

## 2019-05-02 DIAGNOSIS — H524 Presbyopia: Secondary | ICD-10-CM | POA: Diagnosis not present

## 2019-05-29 DIAGNOSIS — E119 Type 2 diabetes mellitus without complications: Secondary | ICD-10-CM | POA: Diagnosis not present

## 2019-09-20 DIAGNOSIS — R69 Illness, unspecified: Secondary | ICD-10-CM | POA: Diagnosis not present

## 2019-09-25 DIAGNOSIS — N200 Calculus of kidney: Secondary | ICD-10-CM | POA: Diagnosis not present

## 2019-09-25 DIAGNOSIS — E119 Type 2 diabetes mellitus without complications: Secondary | ICD-10-CM | POA: Diagnosis not present

## 2019-10-17 DIAGNOSIS — E119 Type 2 diabetes mellitus without complications: Secondary | ICD-10-CM | POA: Diagnosis not present

## 2019-10-17 DIAGNOSIS — R748 Abnormal levels of other serum enzymes: Secondary | ICD-10-CM | POA: Diagnosis not present

## 2019-11-09 DIAGNOSIS — R69 Illness, unspecified: Secondary | ICD-10-CM | POA: Diagnosis not present

## 2019-11-13 DIAGNOSIS — R319 Hematuria, unspecified: Secondary | ICD-10-CM | POA: Diagnosis not present

## 2019-11-13 DIAGNOSIS — E119 Type 2 diabetes mellitus without complications: Secondary | ICD-10-CM | POA: Diagnosis not present

## 2019-11-13 DIAGNOSIS — N200 Calculus of kidney: Secondary | ICD-10-CM | POA: Diagnosis not present

## 2019-11-13 DIAGNOSIS — R748 Abnormal levels of other serum enzymes: Secondary | ICD-10-CM | POA: Diagnosis not present

## 2019-12-31 DIAGNOSIS — R69 Illness, unspecified: Secondary | ICD-10-CM | POA: Diagnosis not present

## 2020-02-04 ENCOUNTER — Other Ambulatory Visit: Payer: Self-pay | Admitting: Neurology

## 2020-02-20 DIAGNOSIS — R69 Illness, unspecified: Secondary | ICD-10-CM | POA: Diagnosis not present

## 2020-04-14 DIAGNOSIS — R69 Illness, unspecified: Secondary | ICD-10-CM | POA: Diagnosis not present

## 2020-04-21 DIAGNOSIS — N200 Calculus of kidney: Secondary | ICD-10-CM | POA: Diagnosis not present

## 2020-04-21 DIAGNOSIS — E119 Type 2 diabetes mellitus without complications: Secondary | ICD-10-CM | POA: Diagnosis not present

## 2020-04-21 DIAGNOSIS — R748 Abnormal levels of other serum enzymes: Secondary | ICD-10-CM | POA: Diagnosis not present

## 2020-04-21 DIAGNOSIS — I10 Essential (primary) hypertension: Secondary | ICD-10-CM | POA: Diagnosis not present

## 2020-04-21 DIAGNOSIS — R319 Hematuria, unspecified: Secondary | ICD-10-CM | POA: Diagnosis not present

## 2020-04-29 ENCOUNTER — Other Ambulatory Visit: Payer: Self-pay | Admitting: Neurology

## 2020-06-04 DIAGNOSIS — R69 Illness, unspecified: Secondary | ICD-10-CM | POA: Diagnosis not present

## 2020-10-20 DIAGNOSIS — I10 Essential (primary) hypertension: Secondary | ICD-10-CM | POA: Diagnosis not present

## 2020-10-20 DIAGNOSIS — R748 Abnormal levels of other serum enzymes: Secondary | ICD-10-CM | POA: Diagnosis not present

## 2020-10-20 DIAGNOSIS — E119 Type 2 diabetes mellitus without complications: Secondary | ICD-10-CM | POA: Diagnosis not present

## 2020-10-27 DIAGNOSIS — R748 Abnormal levels of other serum enzymes: Secondary | ICD-10-CM | POA: Diagnosis not present

## 2020-10-27 DIAGNOSIS — R319 Hematuria, unspecified: Secondary | ICD-10-CM | POA: Diagnosis not present

## 2020-10-27 DIAGNOSIS — N200 Calculus of kidney: Secondary | ICD-10-CM | POA: Diagnosis not present

## 2020-10-27 DIAGNOSIS — E119 Type 2 diabetes mellitus without complications: Secondary | ICD-10-CM | POA: Diagnosis not present

## 2020-10-27 DIAGNOSIS — I10 Essential (primary) hypertension: Secondary | ICD-10-CM | POA: Diagnosis not present

## 2020-12-24 DIAGNOSIS — N132 Hydronephrosis with renal and ureteral calculous obstruction: Secondary | ICD-10-CM | POA: Diagnosis not present

## 2020-12-24 DIAGNOSIS — N200 Calculus of kidney: Secondary | ICD-10-CM | POA: Diagnosis not present

## 2020-12-24 DIAGNOSIS — Z87442 Personal history of urinary calculi: Secondary | ICD-10-CM | POA: Diagnosis not present

## 2020-12-24 DIAGNOSIS — R944 Abnormal results of kidney function studies: Secondary | ICD-10-CM | POA: Diagnosis not present

## 2020-12-26 DIAGNOSIS — R109 Unspecified abdominal pain: Secondary | ICD-10-CM | POA: Diagnosis not present

## 2020-12-26 DIAGNOSIS — N201 Calculus of ureter: Secondary | ICD-10-CM | POA: Diagnosis not present

## 2020-12-26 DIAGNOSIS — N200 Calculus of kidney: Secondary | ICD-10-CM | POA: Diagnosis not present

## 2020-12-26 DIAGNOSIS — N1330 Unspecified hydronephrosis: Secondary | ICD-10-CM | POA: Diagnosis not present

## 2020-12-30 DIAGNOSIS — E119 Type 2 diabetes mellitus without complications: Secondary | ICD-10-CM | POA: Diagnosis not present

## 2020-12-30 DIAGNOSIS — N202 Calculus of kidney with calculus of ureter: Secondary | ICD-10-CM | POA: Diagnosis not present

## 2020-12-30 DIAGNOSIS — N201 Calculus of ureter: Secondary | ICD-10-CM | POA: Diagnosis not present

## 2020-12-30 DIAGNOSIS — N200 Calculus of kidney: Secondary | ICD-10-CM | POA: Diagnosis not present

## 2021-01-08 DIAGNOSIS — Z466 Encounter for fitting and adjustment of urinary device: Secondary | ICD-10-CM | POA: Diagnosis not present

## 2021-01-08 DIAGNOSIS — N200 Calculus of kidney: Secondary | ICD-10-CM | POA: Diagnosis not present

## 2021-03-10 DIAGNOSIS — N200 Calculus of kidney: Secondary | ICD-10-CM | POA: Diagnosis not present

## 2021-03-10 DIAGNOSIS — R319 Hematuria, unspecified: Secondary | ICD-10-CM | POA: Diagnosis not present

## 2021-03-10 DIAGNOSIS — I10 Essential (primary) hypertension: Secondary | ICD-10-CM | POA: Diagnosis not present

## 2021-03-10 DIAGNOSIS — E119 Type 2 diabetes mellitus without complications: Secondary | ICD-10-CM | POA: Diagnosis not present

## 2021-03-10 DIAGNOSIS — R748 Abnormal levels of other serum enzymes: Secondary | ICD-10-CM | POA: Diagnosis not present

## 2021-04-06 DIAGNOSIS — N200 Calculus of kidney: Secondary | ICD-10-CM | POA: Diagnosis not present

## 2021-04-15 DIAGNOSIS — D3617 Benign neoplasm of peripheral nerves and autonomic nervous system of trunk, unspecified: Secondary | ICD-10-CM | POA: Diagnosis not present

## 2021-04-15 DIAGNOSIS — Z85828 Personal history of other malignant neoplasm of skin: Secondary | ICD-10-CM | POA: Diagnosis not present

## 2021-04-15 DIAGNOSIS — L821 Other seborrheic keratosis: Secondary | ICD-10-CM | POA: Diagnosis not present

## 2021-04-15 DIAGNOSIS — L820 Inflamed seborrheic keratosis: Secondary | ICD-10-CM | POA: Diagnosis not present

## 2021-04-15 DIAGNOSIS — D1801 Hemangioma of skin and subcutaneous tissue: Secondary | ICD-10-CM | POA: Diagnosis not present

## 2021-04-15 DIAGNOSIS — D692 Other nonthrombocytopenic purpura: Secondary | ICD-10-CM | POA: Diagnosis not present

## 2021-07-10 DIAGNOSIS — E119 Type 2 diabetes mellitus without complications: Secondary | ICD-10-CM | POA: Diagnosis not present

## 2021-07-10 DIAGNOSIS — R748 Abnormal levels of other serum enzymes: Secondary | ICD-10-CM | POA: Diagnosis not present

## 2021-07-10 DIAGNOSIS — R319 Hematuria, unspecified: Secondary | ICD-10-CM | POA: Diagnosis not present

## 2021-07-10 DIAGNOSIS — I10 Essential (primary) hypertension: Secondary | ICD-10-CM | POA: Diagnosis not present

## 2021-07-10 DIAGNOSIS — N200 Calculus of kidney: Secondary | ICD-10-CM | POA: Diagnosis not present

## 2021-10-07 DIAGNOSIS — N200 Calculus of kidney: Secondary | ICD-10-CM | POA: Diagnosis not present

## 2021-10-07 DIAGNOSIS — I10 Essential (primary) hypertension: Secondary | ICD-10-CM | POA: Diagnosis not present

## 2021-10-07 DIAGNOSIS — E119 Type 2 diabetes mellitus without complications: Secondary | ICD-10-CM | POA: Diagnosis not present

## 2021-10-07 DIAGNOSIS — R319 Hematuria, unspecified: Secondary | ICD-10-CM | POA: Diagnosis not present

## 2021-10-07 DIAGNOSIS — R748 Abnormal levels of other serum enzymes: Secondary | ICD-10-CM | POA: Diagnosis not present

## 2021-11-10 DIAGNOSIS — N200 Calculus of kidney: Secondary | ICD-10-CM | POA: Diagnosis not present

## 2021-11-17 DIAGNOSIS — N200 Calculus of kidney: Secondary | ICD-10-CM | POA: Diagnosis not present

## 2021-11-17 DIAGNOSIS — N400 Enlarged prostate without lower urinary tract symptoms: Secondary | ICD-10-CM | POA: Diagnosis not present

## 2021-11-17 DIAGNOSIS — K760 Fatty (change of) liver, not elsewhere classified: Secondary | ICD-10-CM | POA: Diagnosis not present

## 2021-11-17 DIAGNOSIS — R918 Other nonspecific abnormal finding of lung field: Secondary | ICD-10-CM | POA: Diagnosis not present

## 2021-11-17 DIAGNOSIS — R161 Splenomegaly, not elsewhere classified: Secondary | ICD-10-CM | POA: Diagnosis not present

## 2021-11-24 DIAGNOSIS — N200 Calculus of kidney: Secondary | ICD-10-CM | POA: Diagnosis not present
